# Patient Record
Sex: Male | Born: 1970 | Race: Black or African American | Hispanic: No | Smoking: Current every day smoker
Health system: Southern US, Community
[De-identification: ages and names within clinical notes are randomized; demographics above are authoritative.]

## PROBLEM LIST (undated history)

## (undated) DIAGNOSIS — K469 Unspecified abdominal hernia without obstruction or gangrene: Secondary | ICD-10-CM

## (undated) DIAGNOSIS — K219 Gastro-esophageal reflux disease without esophagitis: Secondary | ICD-10-CM

---

## 1989-09-11 HISTORY — PX: KNEE SURGERY: SHX244

## 2010-08-24 ENCOUNTER — Emergency Department (HOSPITAL_COMMUNITY)
Admission: EM | Admit: 2010-08-24 | Discharge: 2010-08-25 | Payer: Self-pay | Source: Home / Self Care | Admitting: Emergency Medicine

## 2010-09-10 ENCOUNTER — Emergency Department (HOSPITAL_COMMUNITY)
Admission: EM | Admit: 2010-09-10 | Discharge: 2010-09-10 | Payer: Self-pay | Source: Home / Self Care | Admitting: Emergency Medicine

## 2010-11-22 LAB — DIFFERENTIAL
Basophils Absolute: 0 10*3/uL (ref 0.0–0.1)
Eosinophils Absolute: 0.1 10*3/uL (ref 0.0–0.7)
Eosinophils Relative: 1 % (ref 0–5)

## 2010-11-22 LAB — CBC
MCH: 32.3 pg (ref 26.0–34.0)
MCHC: 35.9 g/dL (ref 30.0–36.0)
MCV: 90.1 fL (ref 78.0–100.0)
Platelets: 195 10*3/uL (ref 150–400)
RDW: 14.1 % (ref 11.5–15.5)

## 2010-11-22 LAB — BASIC METABOLIC PANEL
BUN: 8 mg/dL (ref 6–23)
CO2: 25 mEq/L (ref 19–32)
Chloride: 106 mEq/L (ref 96–112)
Creatinine, Ser: 1.1 mg/dL (ref 0.4–1.5)
Glucose, Bld: 133 mg/dL — ABNORMAL HIGH (ref 70–99)

## 2010-11-22 LAB — URINALYSIS, ROUTINE W REFLEX MICROSCOPIC
Bilirubin Urine: NEGATIVE
Ketones, ur: NEGATIVE mg/dL
Nitrite: NEGATIVE
Protein, ur: NEGATIVE mg/dL

## 2011-10-02 ENCOUNTER — Encounter (HOSPITAL_COMMUNITY): Payer: Self-pay | Admitting: *Deleted

## 2011-10-02 ENCOUNTER — Emergency Department (HOSPITAL_COMMUNITY)
Admission: EM | Admit: 2011-10-02 | Discharge: 2011-10-02 | Disposition: A | Discharge status: Home / Self Care | Payer: Self-pay | Attending: Emergency Medicine | Admitting: Emergency Medicine

## 2011-10-02 DIAGNOSIS — F17200 Nicotine dependence, unspecified, uncomplicated: Secondary | ICD-10-CM | POA: Insufficient documentation

## 2011-10-02 DIAGNOSIS — H00019 Hordeolum externum unspecified eye, unspecified eyelid: Secondary | ICD-10-CM | POA: Insufficient documentation

## 2011-10-02 MED ORDER — CIPROFLOXACIN HCL 500 MG PO TABS: 500.0000 mg | ORAL_TABLET | Freq: Two times a day (BID) | ORAL | Status: AC

## 2011-10-02 MED ORDER — HYDROCODONE-ACETAMINOPHEN 5-325 MG PO TABS: ORAL_TABLET | ORAL | Status: DC

## 2011-10-02 MED ORDER — TOBRAMYCIN 0.3 % OP SOLN
2.0000 [drp] | Freq: Once | OPHTHALMIC | Status: AC
  Administered 2011-10-02: 2 [drp] via OPHTHALMIC
  Filled 2011-10-02: qty 5

## 2011-10-02 NOTE — ED Provider Notes (Signed)
History     CSN: [id number]  Arrival date & time 10/02/11  1105   First MD Initiated Contact with Patient 10/02/11 1135      Chief Complaint  Patient presents with  . Eye Pain    (Consider location/radiation/quality/duration/timing/severity/associated sxs/prior treatment) HPI Comments: Patient states he noted a" bump on his eye" approximately 3-4 days ago. The area became quite sore. It most recently the area beside it underneath the left eye has become swollen and tender. He denies any blurred vision double vision or changes in vision. He denies any injury to the eye. He has not been grinding metal or wood, or any high velocity projectile store his eye.  Patient is a 41 y.o. male presenting with eye pain. The history is provided by the patient.  Eye Pain Pertinent negatives include no abdominal pain, arthralgias, chest pain, coughing or neck pain.    History reviewed. No pertinent past medical history.  History reviewed. No pertinent past surgical history.  No family history on file.  History  Substance Use Topics  . Smoking status: Current Everyday Smoker -- 0.5 packs/day for 25 years    Types: Cigarettes  . Smokeless tobacco: Not on file  . Alcohol Use: Yes     Occassionally      Review of Systems  Constitutional: Negative for activity change.       All ROS Neg except as noted in HPI  HENT: Negative for nosebleeds and neck pain.   Eyes: Positive for pain, discharge and redness. Negative for photophobia and visual disturbance.  Respiratory: Negative for cough, shortness of breath and wheezing.   Cardiovascular: Negative for chest pain and palpitations.  Gastrointestinal: Negative for abdominal pain and blood in stool.  Genitourinary: Negative for dysuria, frequency and hematuria.  Musculoskeletal: Negative for back pain and arthralgias.  Skin: Negative.   Neurological: Negative for dizziness, seizures and speech difficulty.  Psychiatric/Behavioral: Negative for  hallucinations and confusion.    Allergies  Penicillins  Home Medications   Current Outpatient Rx  Name Route Sig Dispense Refill  . CIPROFLOXACIN HCL 500 MG PO TABS Oral Take 1 tablet (500 mg total) by mouth every 12 (twelve) hours. 14 tablet 0  . HYDROCODONE-ACETAMINOPHEN 5-325 MG PO TABS  1 or 2 po q4h prn pain 15 tablet 0    BP 121/89  Pulse 72  Temp(Src) 98.4 F (36.9 C) (Oral)  Resp 20  Ht 6' (1.829 m)  Wt 215 lb (97.523 kg)  BMI 29.16 kg/m2  SpO2 100%  Physical Exam  Nursing note and vitals reviewed. Constitutional: He is oriented to person, place, and time. He appears well-developed and well-nourished.  Non-toxic appearance.  HENT:  Head: Normocephalic.  Right Ear: Tympanic membrane and external ear normal.  Left Ear: Tympanic membrane and external ear normal.  Eyes: EOM and lids are normal. Pupils are equal, round, and reactive to light.       There is a stye of the left lateral upper lid of the eye. There is increased redness of the conjunctiva. There is mild soreness of the lateral eye and just under the orbital rim. There is no increased redness. The area is not hot.  Neck: Normal range of motion. Neck supple. Carotid bruit is not present.  Cardiovascular: Normal rate, regular rhythm, normal heart sounds, intact distal pulses and normal pulses.   Pulmonary/Chest: Breath sounds normal. No respiratory distress.  Abdominal: Soft. Bowel sounds are normal. There is no tenderness. There is no guarding.  Musculoskeletal: Normal  range of motion.  Lymphadenopathy:       Head (right side): No submandibular adenopathy present.       Head (left side): No submandibular adenopathy present.    He has no cervical adenopathy.  Neurological: He is alert and oriented to person, place, and time. He has normal strength. No cranial nerve deficit or sensory deficit.  Skin: Skin is warm and dry.  Psychiatric: He has a normal mood and affect. His speech is normal.    ED Course    Procedures (including critical care time)  Labs Reviewed - No data to display No results found.   1. Stye       MDM  I have reviewed nursing notes, vital signs, and all appropriate lab and imaging results for this patient. Patient to use warm compresses to the left eye 3 times a day. Tobramycin eyedrops given. Norco 5 mg to be used for pain not improved by Tylenol or Motrin. Patient is to see an eye specialist if not improving.       Kathie Dike, [place] 10/02/11 [phone]

## 2011-10-02 NOTE — ED Notes (Signed)
Lt eye swelling, started with a "bump on pupil". Pt denies vision loss and injury.

## 2011-10-03 NOTE — ED Provider Notes (Signed)
Medical screening examination/treatment/procedure(s) were performed by non-physician practitioner and as supervising physician I was immediately available for consultation/collaboration.  Doloras Tellado, MD 10/03/11 0709 

## 2011-12-02 ENCOUNTER — Emergency Department (HOSPITAL_COMMUNITY)
Admission: EM | Admit: 2011-12-02 | Discharge: 2011-12-02 | Disposition: A | Discharge status: Home / Self Care | Payer: Self-pay | Attending: Emergency Medicine | Admitting: Emergency Medicine

## 2011-12-02 ENCOUNTER — Encounter (HOSPITAL_COMMUNITY): Payer: Self-pay | Admitting: Emergency Medicine

## 2011-12-02 DIAGNOSIS — F17200 Nicotine dependence, unspecified, uncomplicated: Secondary | ICD-10-CM | POA: Insufficient documentation

## 2011-12-02 DIAGNOSIS — K4090 Unilateral inguinal hernia, without obstruction or gangrene, not specified as recurrent: Secondary | ICD-10-CM | POA: Insufficient documentation

## 2011-12-02 DIAGNOSIS — Z880 Allergy status to penicillin: Secondary | ICD-10-CM | POA: Insufficient documentation

## 2011-12-02 MED ORDER — HYDROCODONE-ACETAMINOPHEN 5-325 MG PO TABS: 1.0000 | ORAL_TABLET | Freq: Four times a day (QID) | ORAL | Status: AC | PRN

## 2011-12-02 NOTE — ED Notes (Signed)
Pt c/o left groin pain x one month. Pt states it is a knot there that he could push in, but now it will not go.

## 2011-12-02 NOTE — ED Provider Notes (Signed)
History     CSN: [id number]  Arrival date & time 12/02/11  1719   First MD Initiated Contact with Patient 12/02/11 1736      Chief Complaint  Patient presents with  . Groin Pain    (Consider location/radiation/quality/duration/timing/severity/associated sxs/prior treatment) HPI Jay Weber is a 41 y.o. male who presents to the Emergency Department complaining of acute onset moderate to severe left groin pain for one month. Discomfort and bulge have been there for one month and pt is able to push it back in. No associated signs or symptoms  History reviewed. No pertinent past medical history.  History reviewed. No pertinent past surgical history.  History reviewed. No pertinent family history.  History  Substance Use Topics  . Smoking status: Current Everyday Smoker -- 0.5 packs/day for 25 years    Types: Cigarettes  . Smokeless tobacco: Not on file  . Alcohol Use: Yes     Occassionally      Review of Systems  HENT: Positive for congestion. Negative for neck pain.   Respiratory: Positive for cough and shortness of breath.   Cardiovascular: Negative for leg swelling.  Gastrointestinal: Positive for abdominal pain (cramping). Negative for nausea, vomiting and diarrhea.  Musculoskeletal: Negative for back pain.  Skin: Negative for rash.  All other systems reviewed and are negative.    Allergies  Penicillins  Home Medications   Current Outpatient Rx  Name Route Sig Dispense Refill  . HYDROCODONE-ACETAMINOPHEN 5-325 MG PO TABS  1 or 2 po q4h prn pain 15 tablet 0  . HYDROCODONE-ACETAMINOPHEN 5-325 MG PO TABS Oral Take 1-2 tablets by mouth every 6 (six) hours as needed for pain. 10 tablet 0    BP 119/72  Pulse 88  Temp(Src) 98 F (36.7 C) (Oral)  Resp 18  Ht 6' (1.829 m)  Wt 214 lb (97.07 kg)  BMI 29.02 kg/m2  SpO2 100%  Physical Exam  Nursing note and vitals reviewed. Constitutional: He is oriented to person, place, and time. He appears  well-developed and well-nourished.  HENT:  Head: Normocephalic and atraumatic.  Neck: Normal range of motion. Neck supple.  Cardiovascular: Normal rate, regular rhythm and normal heart sounds.   Pulmonary/Chest: Effort normal and breath sounds normal. No respiratory distress.  Abdominal: Soft. Bowel sounds are normal. A hernia is present. Hernia confirmed positive in the left inguinal area. Hernia confirmed negative in the right inguinal area.  Genitourinary: Penis normal. Circumcised.       Left inguinal hernia with a bulge that reduces  Musculoskeletal: Normal range of motion.  Neurological: He is alert and oriented to person, place, and time.  Skin: Skin is warm and dry.  Psychiatric: He has a normal mood and affect. His behavior is normal. Judgment and thought content normal.    ED Course  Procedures (including critical care time) DIAGNOSTIC STUDIES: Oxygen Saturation is 100% on room air, normal by my interpretation.    COORDINATION OF CARE:  Medications  HYDROcodone-acetaminophen (NORCO) 5-325 MG per tablet (not administered)      Labs Reviewed - No data to display No results found.   1. Left inguinal hernia       MDM  Physical findings consistent with a reducible left inguinal hernia no apparent complications at this time. Referral to general surgery made.    I personally performed the services described in this documentation, which was scribed in my presence. The recorded information has been reviewed and considered.  Shelda Jakes, MD 12/02/11 [phone]

## 2011-12-02 NOTE — Discharge Instructions (Signed)
Hernia A hernia happens when an organ inside your body pushes out through a weak spot in your belly (abdominal) wall. Most hernias get worse over time. They can often be pushed back into place (reduced). Surgery may be needed to repair hernias that cannot be pushed into place. HOME CARE  Keep doing normal activities.   Avoid lifting more than 10 pounds (4.5 kilograms).   Cough gently and avoid straining. Over time, these things will:   Increase your hernia size.   Irritate your hernia.   Break down hernia repairs.   Stop smoking.   Do not wear anything tight over your hernia. Do not keep the hernia in with an outside bandage.   Eat food that is high in fiber (fruit, vegetables, whole grains).   Drink enough fluids to keep your pee (urine) clear or pale yellow.   Take medicines to make your poop soft (stool softeners) if you cannot poop (constipated).  GET HELP RIGHT AWAY IF:   You have a fever.   You have belly pain that gets worse.   You feel sick to your stomach (nauseous) and throw up (vomit).   Your skin starts to bulge out.   Your hernia turns a different color, feels hard, or is tender.   You have increased pain or puffiness (swelling) around the hernia.   You poop more or less often.   Your poop does not look the way normally does.   You have watery poop (diarrhea).   You cannot push the hernia back in place by applying gentle pressure while lying down.  MAKE SURE YOU:   Understand these instructions.   Will watch your condition.   Will get help right away if you are not doing well or get worse.  Document Released: 02/15/2010 Document Revised: 08/17/2011 Document Reviewed: 02/15/2010 [employer] Patient Information 2012 [employer]. 

## 2011-12-18 NOTE — Patient Instructions (Addendum)
20 JAQUA CHING  12/18/2011   Your procedure is scheduled on:   12/22/2011  Report to [facility] at  715  AM.  Call this number if you have problems the morning of surgery: [phone]   Remember:   Do not eat food:After Midnight.  May have clear liquids:until Midnight .  Clear liquids include soda, tea, black coffee, apple or grape juice, broth.  Take these medicines the morning of surgery with A SIP OF WATER:  none   Do not wear jewelry, make-up or nail polish.  Do not wear lotions, powders, or perfumes. You may wear deodorant.  Do not shave 48 hours prior to surgery.  Do not bring valuables to the hospital.  Contacts, dentures or bridgework may not be worn into surgery.  Leave suitcase in the car. After surgery it may be brought to your room.  For patients admitted to the hospital, checkout time is 11:00 AM the day of discharge.   Patients discharged the day of surgery will not be allowed to drive home.  Name and phone number of your driver: family  Special Instructions: CHG Shower Use Special Wash: 1/2 bottle night before surgery and 1/2 bottle morning of surgery.   Please read over the following fact sheets that you were given: Pain Booklet, MRSA Information, Surgical Site Infection Prevention, Anesthesia Post-op Instructions and Care and Recovery After Surgery Inguinal Hernia, Adult Muscles help keep everything in the body in its proper place. But if a weak spot in the muscles develops, something can poke through. That is called a hernia. When this happens in the lower part of the belly (abdomen), it is called an inguinal hernia. (It takes its name from a part of the body in this region called the inguinal canal.) A weak spot in the wall of muscles lets some fat or part of the small intestine bulge through. An inguinal hernia can develop at any age. Men get them more often than women. CAUSES  In adults, an inguinal hernia develops over time.  It can be triggered by:   Suddenly  straining the muscles of the lower abdomen.   Lifting heavy objects.   Straining to have a bowel movement. Difficult bowel movements (constipation) can lead to this.   Constant coughing. This may be caused by smoking or lung disease.   Being overweight.   Being pregnant.   Working at a job that requires long periods of standing or heavy lifting.   Having had an inguinal hernia before.  One type can be an emergency situation. It is called a strangulated inguinal hernia. It develops if part of the small intestine slips through the weak spot and cannot get back into the abdomen. The blood supply can be cut off. If that happens, part of the intestine may die. This situation requires emergency surgery. SYMPTOMS  Often, a small inguinal hernia has no symptoms. It is found when a healthcare provider does a physical exam. Larger hernias usually have symptoms.   In adults, symptoms may include:   A lump in the groin. This is easier to see when the person is standing. It might disappear when lying down.   In men, a lump in the scrotum.   Pain or burning in the groin. This occurs especially when lifting, straining or coughing.   A dull ache or feeling of pressure in the groin.   Signs of a strangulated hernia can include:   A bulge in the groin that becomes very painful and  tender to the touch.   A bulge that turns red or purple.   Fever, nausea and vomiting.   Inability to have a bowel movement or to pass gas.  DIAGNOSIS  To decide if you have an inguinal hernia, a healthcare provider will probably do a physical examination.  This will include asking questions about any symptoms you have noticed.   The healthcare provider might feel the groin area and ask you to cough. If an inguinal hernia is felt, the healthcare provider may try to slide it back into the abdomen.   Usually no other tests are needed.  TREATMENT  Treatments can vary. The size of the hernia makes a difference.  Options include:  Watchful waiting. This is often suggested if the hernia is small and you have had no symptoms.   No medical procedure will be done unless symptoms develop.   You will need to watch closely for symptoms. If any occur, contact your healthcare provider right away.   Surgery. This is used if the hernia is larger or you have symptoms.   Open surgery. This is usually an outpatient procedure (you will not stay overnight in a hospital). An cut (incision) is made through the skin in the groin. The hernia is put back inside the abdomen. The weak area in the muscles is then repaired by herniorrhaphy or hernioplasty. Herniorrhaphy: in this type of surgery, the weak muscles are sewn back together. Hernioplasty: a patch or mesh is used to close the weak area in the abdominal wall.   Laparoscopy. In this procedure, a surgeon makes small incisions. A thin tube with a tiny video camera (called a laparoscope) is put into the abdomen. The surgeon repairs the hernia with mesh by looking with the video camera and using two long instruments.  HOME CARE INSTRUCTIONS   After surgery to repair an inguinal hernia:   You will need to take pain medicine prescribed by your healthcare provider. Follow all directions carefully.   You will need to take care of the wound from the incision.   Your activity will be restricted for awhile. This will probably include no heavy lifting for several weeks. You also should not do anything too active for a few weeks. When you can return to work will depend on the type of job that you have.   During "watchful waiting" periods, you should:   Maintain a healthy weight.   Eat a diet high in fiber (fruits, vegetables and whole grains).   Drink plenty of fluids to avoid constipation. This means drinking enough water and other liquids to keep your urine clear or pale yellow.   Do not lift heavy objects.   Do not stand for long periods of time.   Quit smoking. This  should keep you from developing a frequent cough.  SEEK MEDICAL CARE IF:   A bulge develops in your groin area.   You feel pain, a burning sensation or pressure in the groin. This might be worse if you are lifting or straining.   You develop a fever of more than 100.5 F (38.1 C).  SEEK IMMEDIATE MEDICAL CARE IF:   Pain in the groin increases suddenly.   A bulge in the groin gets bigger suddenly and does not go down.   For men, there is sudden pain in the scrotum. Or, the size of the scrotum increases.   A bulge in the groin area becomes red or purple and is painful to touch.   You  have nausea or vomiting that does not go away.   You feel your heart beating much faster than normal.   You cannot have a bowel movement or pass gas.   You develop a fever of more than 102.0 F (38.9 C).  Document Released: 01/14/2009 Document Revised: 08/17/2011 Document Reviewed: 01/14/2009 [facility] Patient Information 2012 [place], [place].PATIENT INSTRUCTIONS POST-ANESTHESIA  IMMEDIATELY FOLLOWING SURGERY:  Do not drive or operate machinery for the first twenty four hours after surgery.  Do not make any important decisions for twenty four hours after surgery or while taking narcotic pain medications or sedatives.  If you develop intractable nausea and vomiting or a severe headache please notify your doctor immediately.  FOLLOW-UP:  Please make an appointment with your surgeon as instructed. You do not need to follow up with anesthesia unless specifically instructed to do so.  WOUND CARE INSTRUCTIONS (if applicable):  Keep a dry clean dressing on the anesthesia/puncture wound site if there is drainage.  Once the wound has quit draining you may leave it open to air.  Generally you should leave the bandage intact for twenty four hours unless there is drainage.  If the epidural site drains for more than 36-48 hours please call the anesthesia department.  QUESTIONS?:  Please feel free to call your  physician or the hospital operator if you have any questions, and they will be happy to assist you.     [facility] Anesthesia Department [address] [place] [phone]

## 2011-12-19 ENCOUNTER — Encounter (HOSPITAL_COMMUNITY): Payer: Self-pay | Admitting: Pharmacy Technician

## 2011-12-19 ENCOUNTER — Encounter (HOSPITAL_COMMUNITY)
Admission: RE | Admit: 2011-12-19 | Discharge: 2011-12-19 | Disposition: A | Discharge status: Home / Self Care | Payer: Self-pay | Source: Ambulatory Visit | Attending: General Surgery | Admitting: General Surgery

## 2011-12-19 ENCOUNTER — Encounter (HOSPITAL_COMMUNITY): Payer: Self-pay

## 2011-12-19 HISTORY — DX: Gastro-esophageal reflux disease without esophagitis: K21.9

## 2011-12-19 LAB — BASIC METABOLIC PANEL
BUN: 7 mg/dL (ref 6–23)
CO2: 26 mEq/L (ref 19–32)
Calcium: 9.6 mg/dL (ref 8.4–10.5)
Creatinine, Ser: 0.98 mg/dL (ref 0.50–1.35)
GFR calc non Af Amer: >90 mL/min (ref >90)
Glucose, Bld: 87 mg/dL (ref 70–99)
Sodium: 140 mEq/L (ref 135–145)

## 2011-12-19 LAB — DIFFERENTIAL
Eosinophils Absolute: 0.1 10*3/uL (ref 0.0–0.7)
Eosinophils Relative: 1 % (ref 0–5)
Lymphs Abs: 2.5 10*3/uL (ref 0.7–4.0)
Monocytes Absolute: 0.5 10*3/uL (ref 0.1–1.0)

## 2011-12-19 LAB — CBC
HCT: 41.8 % (ref 39.0–52.0)
MCH: 32.1 pg (ref 26.0–34.0)
MCV: 93.1 fL (ref 78.0–100.0)
Platelets: 187 10*3/uL (ref 150–400)
RBC: 4.49 MIL/uL (ref 4.22–5.81)

## 2011-12-19 MED ORDER — CHLORHEXIDINE GLUCONATE 4 % EX LIQD
1.0000 "application " | Freq: Once | CUTANEOUS | Status: DC
  Filled 2011-12-19: qty 15

## 2011-12-19 NOTE — Progress Notes (Signed)
Contact patient at [phone].

## 2011-12-22 ENCOUNTER — Encounter (HOSPITAL_COMMUNITY): Payer: Self-pay | Admitting: Anesthesiology

## 2011-12-22 ENCOUNTER — Encounter (HOSPITAL_COMMUNITY): Payer: Self-pay | Admitting: *Deleted

## 2011-12-22 ENCOUNTER — Encounter (HOSPITAL_COMMUNITY)
Admission: RE | Disposition: A | Discharge status: Home / Self Care | Payer: Self-pay | Source: Ambulatory Visit | Attending: General Surgery

## 2011-12-22 ENCOUNTER — Ambulatory Visit (HOSPITAL_COMMUNITY)
Admission: RE | Admit: 2011-12-22 | Discharge: 2011-12-22 | Disposition: A | Discharge status: Home / Self Care | Payer: Self-pay | Source: Ambulatory Visit | Attending: General Surgery | Admitting: General Surgery

## 2011-12-22 ENCOUNTER — Ambulatory Visit (HOSPITAL_COMMUNITY): Payer: Self-pay | Admitting: Anesthesiology

## 2011-12-22 DIAGNOSIS — K4090 Unilateral inguinal hernia, without obstruction or gangrene, not specified as recurrent: Secondary | ICD-10-CM | POA: Insufficient documentation

## 2011-12-22 DIAGNOSIS — Z01812 Encounter for preprocedural laboratory examination: Secondary | ICD-10-CM | POA: Insufficient documentation

## 2011-12-22 HISTORY — PX: INGUINAL HERNIA REPAIR: SHX194

## 2011-12-22 SURGERY — REPAIR, HERNIA, INGUINAL, ADULT
Anesthesia: General | Site: Groin | Laterality: Left | Wound class: Clean

## 2011-12-22 MED ORDER — ONDANSETRON HCL 4 MG/2ML IJ SOLN: 4.0000 mg | Freq: Once | INTRAMUSCULAR | Status: DC | PRN

## 2011-12-22 MED ORDER — PROPOFOL 10 MG/ML IV EMUL
INTRAVENOUS | Status: AC
  Filled 2011-12-22: qty 20

## 2011-12-22 MED ORDER — MIDAZOLAM HCL 2 MG/2ML IJ SOLN
INTRAMUSCULAR | Status: AC
  Administered 2011-12-22: 2 mg via INTRAVENOUS
  Filled 2011-12-22: qty 2

## 2011-12-22 MED ORDER — CELECOXIB 100 MG PO CAPS
ORAL_CAPSULE | ORAL | Status: DC
Start: 2011-12-22 — End: 2011-12-22
  Filled 2011-12-22: qty 4

## 2011-12-22 MED ORDER — LACTATED RINGERS IV SOLN
INTRAVENOUS | Status: DC
  Administered 2011-12-22: 1000 mL via INTRAVENOUS
  Administered 2011-12-22: 11:00:00 via INTRAVENOUS

## 2011-12-22 MED ORDER — MIDAZOLAM HCL 2 MG/2ML IJ SOLN
INTRAMUSCULAR | Status: AC
  Filled 2011-12-22: qty 2

## 2011-12-22 MED ORDER — FENTANYL CITRATE 0.05 MG/ML IJ SOLN
25.0000 ug | INTRAMUSCULAR | Status: DC | PRN
  Administered 2011-12-22 (×2): 50 ug via INTRAVENOUS

## 2011-12-22 MED ORDER — CLINDAMYCIN PHOSPHATE 600 MG/50ML IV SOLN
INTRAVENOUS | Status: DC | PRN
  Administered 2011-12-22: 600 mg via INTRAVENOUS

## 2011-12-22 MED ORDER — MIDAZOLAM HCL 5 MG/5ML IJ SOLN
INTRAMUSCULAR | Status: DC | PRN
  Administered 2011-12-22: 2 mg via INTRAVENOUS

## 2011-12-22 MED ORDER — MIDAZOLAM HCL 2 MG/2ML IJ SOLN
1.0000 mg | INTRAMUSCULAR | Status: DC | PRN
  Administered 2011-12-22: 2 mg via INTRAVENOUS

## 2011-12-22 MED ORDER — GLYCOPYRROLATE 0.2 MG/ML IJ SOLN
INTRAMUSCULAR | Status: AC
  Administered 2011-12-22: 0.2 mg via INTRAVENOUS
  Filled 2011-12-22: qty 1

## 2011-12-22 MED ORDER — ENOXAPARIN SODIUM 40 MG/0.4ML ~~LOC~~ SOLN
40.0000 mg | Freq: Once | SUBCUTANEOUS | Status: AC
  Administered 2011-12-22: 40 mg via SUBCUTANEOUS

## 2011-12-22 MED ORDER — GLYCOPYRROLATE 0.2 MG/ML IJ SOLN
0.2000 mg | Freq: Once | INTRAMUSCULAR | Status: AC
  Administered 2011-12-22: 0.2 mg via INTRAVENOUS

## 2011-12-22 MED ORDER — FENTANYL CITRATE 0.05 MG/ML IJ SOLN
INTRAMUSCULAR | Status: AC
  Administered 2011-12-22: 50 ug via INTRAVENOUS
  Filled 2011-12-22: qty 2

## 2011-12-22 MED ORDER — ACETAMINOPHEN 325 MG PO TABS: 325.0000 mg | ORAL_TABLET | ORAL | Status: DC | PRN

## 2011-12-22 MED ORDER — CELECOXIB 100 MG PO CAPS
400.0000 mg | ORAL_CAPSULE | Freq: Every day | ORAL | Status: AC
  Administered 2011-12-22: 400 mg via ORAL

## 2011-12-22 MED ORDER — BUPIVACAINE HCL (PF) 0.5 % IJ SOLN
INTRAMUSCULAR | Status: AC
  Filled 2011-12-22: qty 30

## 2011-12-22 MED ORDER — ONDANSETRON HCL 4 MG/2ML IJ SOLN
INTRAMUSCULAR | Status: AC
  Administered 2011-12-22: 4 mg via INTRAVENOUS
  Filled 2011-12-22: qty 2

## 2011-12-22 MED ORDER — CLINDAMYCIN PHOSPHATE 600 MG/50ML IV SOLN
INTRAVENOUS | Status: AC
  Filled 2011-12-22: qty 50

## 2011-12-22 MED ORDER — CLINDAMYCIN PHOSPHATE 600 MG/50ML IV SOLN: 600.0000 mg | Freq: Once | INTRAVENOUS | Status: DC

## 2011-12-22 MED ORDER — PROPOFOL 10 MG/ML IV BOLUS
INTRAVENOUS | Status: DC | PRN
  Administered 2011-12-22: 170 mg via INTRAVENOUS

## 2011-12-22 MED ORDER — ENOXAPARIN SODIUM 40 MG/0.4ML ~~LOC~~ SOLN
SUBCUTANEOUS | Status: AC
  Administered 2011-12-22: 40 mg via SUBCUTANEOUS
  Filled 2011-12-22: qty 0.4

## 2011-12-22 MED ORDER — SODIUM CHLORIDE 0.9 % IR SOLN
Status: DC | PRN
  Administered 2011-12-22: 1000 mL

## 2011-12-22 MED ORDER — FENTANYL CITRATE 0.05 MG/ML IJ SOLN
INTRAMUSCULAR | Status: DC | PRN
  Administered 2011-12-22 (×2): 25 ug via INTRAVENOUS
  Administered 2011-12-22: 50 ug via INTRAVENOUS

## 2011-12-22 MED ORDER — LIDOCAINE HCL 1 % IJ SOLN
INTRAMUSCULAR | Status: DC | PRN
  Administered 2011-12-22: 30 mg via INTRADERMAL

## 2011-12-22 MED ORDER — HYDROCODONE-ACETAMINOPHEN 5-325 MG PO TABS: 1.0000 | ORAL_TABLET | ORAL | Status: AC | PRN

## 2011-12-22 MED ORDER — BUPIVACAINE HCL (PF) 0.5 % IJ SOLN
INTRAMUSCULAR | Status: DC | PRN
  Administered 2011-12-22: 10 mL

## 2011-12-22 MED ORDER — ONDANSETRON HCL 4 MG/2ML IJ SOLN
4.0000 mg | Freq: Once | INTRAMUSCULAR | Status: AC
  Administered 2011-12-22: 4 mg via INTRAVENOUS

## 2011-12-22 SURGICAL SUPPLY — 38 items
BAG HAMPER (MISCELLANEOUS) ×2 IMPLANT
BENZOIN TINCTURE PRP APPL 2/3 (GAUZE/BANDAGES/DRESSINGS) ×2 IMPLANT
CLOTH BEACON ORANGE TIMEOUT ST (SAFETY) ×2 IMPLANT
COVER SURGICAL LIGHT HANDLE (MISCELLANEOUS) ×4 IMPLANT
DECANTER SPIKE VIAL GLASS SM (MISCELLANEOUS) ×2 IMPLANT
DRAIN PENROSE 18X.75 LTX STRL (MISCELLANEOUS) ×2 IMPLANT
DURAPREP 26ML APPLICATOR (WOUND CARE) ×2 IMPLANT
ELECT REM PT RETURN 9FT ADLT (ELECTROSURGICAL) ×2
ELECTRODE REM PT RTRN 9FT ADLT (ELECTROSURGICAL) ×1 IMPLANT
FORMALIN 10 PREFIL 120ML (MISCELLANEOUS) ×2 IMPLANT
GLOVE BIOGEL PI IND STRL 7.5 (GLOVE) ×1 IMPLANT
GLOVE BIOGEL PI INDICATOR 7.5 (GLOVE) ×1
GLOVE ECLIPSE 6.5 STRL STRAW (GLOVE) ×4 IMPLANT
GLOVE ECLIPSE 7.0 STRL STRAW (GLOVE) ×4 IMPLANT
GLOVE EXAM NITRILE MD LF STRL (GLOVE) ×2 IMPLANT
GLOVE INDICATOR 7.0 STRL GRN (GLOVE) ×6 IMPLANT
GOWN STRL REIN XL XLG (GOWN DISPOSABLE) ×6 IMPLANT
INST SET MINOR GENERAL (KITS) ×2 IMPLANT
KIT ROOM TURNOVER APOR (KITS) ×2 IMPLANT
MANIFOLD NEPTUNE II (INSTRUMENTS) ×2 IMPLANT
MESH MARLEX PLUG MEDIUM (Mesh General) ×2 IMPLANT
NEEDLE HYPO 25X1 1.5 SAFETY (NEEDLE) ×2 IMPLANT
NS IRRIG 1000ML POUR BTL (IV SOLUTION) ×2 IMPLANT
PACK MINOR (CUSTOM PROCEDURE TRAY) ×2 IMPLANT
PAD ARMBOARD 7.5X6 YLW CONV (MISCELLANEOUS) ×2 IMPLANT
SET BASIN LINEN APH (SET/KITS/TRAYS/PACK) ×2 IMPLANT
STRIP CLOSURE SKIN 1/2X4 (GAUZE/BANDAGES/DRESSINGS) ×2 IMPLANT
SUT ETHIBOND NAB MO 7 #0 18IN (SUTURE) ×2 IMPLANT
SUT MNCRL AB 4-0 PS2 18 (SUTURE) ×2 IMPLANT
SUT SILK 2 0 (SUTURE)
SUT SILK 2-0 18XBRD TIE 12 (SUTURE) IMPLANT
SUT VIC AB 2-0 CT1 27 (SUTURE) ×1
SUT VIC AB 2-0 CT1 TAPERPNT 27 (SUTURE) ×1 IMPLANT
SUT VIC AB 3-0 SH 27 (SUTURE) ×1
SUT VIC AB 3-0 SH 27X BRD (SUTURE) ×1 IMPLANT
SUT VICRYL AB 3 0 TIES (SUTURE) IMPLANT
SYR BULB IRRIGATION 50ML (SYRINGE) ×2 IMPLANT
SYR CONTROL 10ML LL (SYRINGE) ×2 IMPLANT

## 2011-12-22 NOTE — Discharge Instructions (Signed)
Inguinal Hernia, Adult  Care After Refer to this sheet in the next few weeks. These discharge instructions provide you with general information on caring for yourself after you leave the hospital. Your caregiver may also give you specific instructions. Your treatment has been planned according to the most current medical practices available, but unavoidable complications sometimes occur. If you have any problems or questions after discharge, please call your caregiver. HOME CARE INSTRUCTIONS  Put ice on the operative site.   Put ice in a plastic bag.   Place a towel between your skin and the bag.   Leave the ice on for 15 to 20 minutes at a time, 3 to 4 times a day while awake.   Change bandages (dressings) as directed.   Keep the wound dry and clean. The wound may be washed gently with soap and water. Gently blot or dab the wound dry. It is okay to take showers 24 to 48 hours after surgery. Do not take baths, use swimming pools, or use hot tubs for 10 days, or as directed by your caregiver.   Only take over-the-counter or prescription medicines for pain, discomfort, or fever as directed by your caregiver.   Continue your normal diet as directed.   Do not lift anything more than 10 pounds or play contact sports for 3 weeks, or as directed.  SEEK MEDICAL CARE IF:  There is redness, swelling, or increasing pain in the wound.   There is fluid (pus) coming from the wound.   There is drainage from a wound lasting longer than 1 day.   You have an oral temperature above 102 F (38.9 C).   You notice a bad smell coming from the wound or dressing.   The wound breaks open after the stitches (sutures) have been removed.   You notice increasing pain in the shoulders (shoulder strap areas).   You develop dizzy episodes or fainting while standing.   You feel sick to your stomach (nauseous) or throw up (vomit).  SEEK IMMEDIATE MEDICAL CARE IF:  You develop a rash.   You have difficulty  breathing.   You develop a reaction or have side effects to medicines you were given.  MAKE SURE YOU:   Understand these instructions.   Will watch your condition.   Will get help right away if you are not doing well or get worse.  Document Released: 09/28/2006 Document Revised: 08/17/2011 Document Reviewed: 07/28/2009 [employer] Patient Information 2012 [employer].   

## 2011-12-22 NOTE — Anesthesia Procedure Notes (Signed)
Procedure Name: LMA Insertion Date/Time: 12/22/2011 9:35 AM Performed by: Despina Hidden Pre-anesthesia Checklist: Patient identified, Patient being monitored, Suction available and Emergency Drugs available Patient Re-evaluated:Patient Re-evaluated prior to inductionOxygen Delivery Method: Circle system utilized Preoxygenation: Pre-oxygenation with 100% oxygen Intubation Type: IV induction Ventilation: Mask ventilation with difficulty LMA: LMA inserted LMA Size: 4.0 Number of attempts: 2 Placement Confirmation: breath sounds checked- equal and bilateral and positive ETCO2 Tube secured with: Tape Dental Injury: Teeth and Oropharynx as per pre-operative assessment  Comments: Difficult mask due to thick neck and face

## 2011-12-22 NOTE — Transfer of Care (Signed)
Immediate Anesthesia Transfer of Care Note  Patient: Jay Weber  Procedure(s) Performed: Procedure(s) (LRB): HERNIA REPAIR INGUINAL ADULT (Left)  Patient Location: PACU  Anesthesia Type: General  Level of Consciousness: awake and patient cooperative  Airway & Oxygen Therapy: Patient Spontanous Breathing and Patient connected to face mask oxygen  Post-op Assessment: Report given to PACU RN, Post -op Vital signs reviewed and stable and Patient moving all extremities  Post vital signs: Reviewed and stable  Complications: No apparent anesthesia complications

## 2011-12-22 NOTE — Anesthesia Postprocedure Evaluation (Signed)
  Anesthesia Post-op Note  Patient: Jay Weber  Procedure(s) Performed: Procedure(s) (LRB): HERNIA REPAIR INGUINAL ADULT (Left)  Patient Location: PACU  Anesthesia Type: General  Level of Consciousness: awake, alert , oriented and patient cooperative  Airway and Oxygen Therapy: Patient Spontanous Breathing  Post-op Pain: 2 /10, mild  Post-op Assessment: Post-op Vital signs reviewed, Patient's Cardiovascular Status Stable, Respiratory Function Stable, Patent Airway and No signs of Nausea or vomiting  Post-op Vital Signs: Reviewed and stable  Complications: No apparent anesthesia complications

## 2011-12-22 NOTE — Anesthesia Preprocedure Evaluation (Signed)
Anesthesia Evaluation  Patient identified by MRN, date of birth, ID band Patient awake    Reviewed: Allergy & Precautions, H&P , NPO status , Patient's Chart, lab work & pertinent test results  Airway Mallampati: I TM Distance: >3 FB Neck ROM: Full    Dental No notable dental hx. (+) Teeth Intact   Pulmonary Current Smoker,    Pulmonary exam normal       Cardiovascular negative cardio ROS  Rhythm:Regular Rate:Normal     Neuro/Psych negative neurological ROS  negative psych ROS   GI/Hepatic Neg liver ROS, GERD-  Controlled,  Endo/Other  negative endocrine ROS  Renal/GU negative Renal ROS     Musculoskeletal negative musculoskeletal ROS (+)   Abdominal   Peds  Hematology negative hematology ROS (+)   Anesthesia Other Findings   Reproductive/Obstetrics                           Anesthesia Physical Anesthesia Plan  ASA: II  Anesthesia Plan: General   Post-op Pain Management:    Induction: Intravenous  Airway Management Planned: LMA  Additional Equipment:   Intra-op Plan:   Post-operative Plan: Extubation in OR  Informed Consent: I have reviewed the patients History and Physical, chart, labs and discussed the procedure including the risks, benefits and alternatives for the proposed anesthesia with the patient or authorized representative who has indicated his/her understanding and acceptance.     Plan Discussed with: CRNA  Anesthesia Plan Comments:         Anesthesia Quick Evaluation

## 2011-12-22 NOTE — Op Note (Signed)
Patient:  Jay Weber  DOB:  07-Jul-1971  MRN:  [id number]   Preop Diagnosis:  Left inguinal hernia  Postop Diagnosis:  The same  Procedure:  Left inguinal hernia repair with mesh  Surgeon:  Dr. Tilford Pillar  Anes:  General endotracheal, 0.5% Sensorcaine plain for local  Indications:  Patient is a 41 year old male presented my office with a history of a left inguinal bulge. Workup and evaluation was consistent with a left inguinal hernia. Risks benefits alternatives of a dual hernia repair with mesh were discussed at length patient including but not limited to risk of bleeding, infection, infection and mesh requiring removal and subsequent repair, chronic pain, ischemic orchiditis, testicular loss, intraoperative cardiac and coronary events. Patient's questions and concerns were addressed. Consent is obtained.  Procedure note:  Patient is taken to the or was placed in supine position on the or table which time a general anesthetic was missed her period was patient was asleep he was endotracheally intubated by the nurse anesthetist. At this point his abdomen is prepped with chlorhexidine solution and draped in standard fashion. A left inguinal incision was created with a 10 blade scalpel. Additional dissection down to subcuticular tissues including Scarpa's fascia is electrocautery. The external oblique fascia is scored with a 15 blade scalpel. It is opened medially to the external inguinal ring with Metzenbaum scissors. The cord structures and hernia sac were identified and bluntly shipped off the surrounding canal walls with blunt digital dissection. A window screw behind the cord structures over the pubic tubercle. A Penrose drain is utilized to help retract and elevate the cord structures into the field. At this point the hernia sac is identified and was dissected off the cord structures. The sac is divided. The distal portion of the sac is ligated with a Vicryl suture. The proximal sac  Pershing suture placed to help close the peritoneal defect. This is performed with an 0 Ethibond suture. A medium mesh plug is inserted. This is secured with 2 separate 0 Ethibond sutures. The mesh onlay was then brought to the field and was tacked medially over the pubic tubercle, inferiorly to the inguinal ligament, superiorly to the conjoined tendon, and laterally the tracheal defect was secured around the cord structures and again secured with 0 Ethibond suture. The tails of the mesh are placed in the external oblique fascia. As quite pleased with the appearance of the repair. Irrigation was instilled. The external oblique fascia was reapproximated using a 2-0 Vicryl in a running continuous fashion. Local acetic is instilled. A 3-0 Vicryl sutures utilized reapproximate Scarpa's fascia. A 4-0 Monocryl utilized reapproximate skin edges. The skin was washed dried moist dry towel. Benzoin is applied. Half-inch are suture placed. Additionally patient left come out of general anesthetic and stretcher the postanesthetic care unit in stable condition. At the conclusion of procedure all instrument, sponge, needle counts are correct. Patient tolerated procedure extremely well.  Complications:  None  EBL:  Minimal  Specimen:  None

## 2011-12-22 NOTE — Interval H&P Note (Signed)
History and Physical Interval Note:  12/22/2011 9:18 AM  Jay Weber  has presented today for surgery, with the diagnosis of Inguinal hernia without mention of obstruction or gangrene, unilateral or unspecified, not specified as recurrent  The various methods of treatment have been discussed with the patient and family. After consideration of risks, benefits and other options for treatment, the patient has consented to  Procedure(s) (LRB): HERNIA REPAIR INGUINAL ADULT (Left) as a surgical intervention .  The patients' history has been reviewed, patient examined, no change in status, stable for surgery.  I have reviewed the patients' chart and labs.  Questions were answered to the patient's satisfaction.     Ugonna Keirsey C

## 2011-12-22 NOTE — H&P (Signed)
  NTS SOAP Note  Vital Signs:  Vitals as of: 12/07/2011: Systolic 139: Diastolic 77: Heart Rate 67: Temp 98.61F: Height 28ft 0in: Weight 215Lbs 0 Ounces: OFC 0in: Respiratory Rate 0: O2 Saturation 0: Pain Level 0: BMI 29  BMI : 29.16 kg/m2  Subjective: This 41 Years 79 Months old Male presents for of note pain and bulge in L groin.  Present for the last couple weeks.  Has been increasing in size.  Reduces by description.  No ssx of incarceration or strangulation.  No fever or chills.  No prior history.  Patient states cannot return to work until fixed per employer.  No change in BM.  No trauma.  Review of Symptoms:  Constitutional:unremarkable Head:unremarkable Eyes:unremarkable Nose/Mouth/Throat:unremarkable Cardiovascular:unremarkable Respiratory:unremarkable as per HPI Genitourinary:unremarkable Musculoskeletal:unremarkable Skin:unremarkable Breast:unremarkable Hematolgic/Lymphatic:unremarkable Allergic/Immunologic:unremarkable   Past Medical History:Obtained   Past Medical History  Surgical History: none Medical Problems: none Psychiatric History: none Allergies: PCN:  rash Medications: none   Social History:Obtained  Social History  Preferred Language: English ([place]) Race:  Black or African American Ethnicity: Not Hispanic / Latino Age: 41 Years 0 Months Marital Status:  D Alcohol: 6pack/week Recreational drug(s): none   Smoking Status: Current every day smoker reviewed on 12/10/2011 Started Date: 09/12/1987 Packs per day: 1.00   Family History:Obtained   Family History  Is there a family history [id number]    Objective Information: General:Well appearing, well nourished in no distress. Skin:no rash or prominent lesions Head:Atraumatic; no masses; no abnormalities Eyes:conjunctiva clear, EOM intact, PERRL Mouth:Mucous membranes moist, no mucosal  lesions. Neck:Supple without lymphadenopathy.  Heart:RRR, no murmur Lungs:CTA bilaterally, no wheezes, rhonchi, rales.  Breathing unlabored. Abdomen:Soft, NT/ND, no HSM, no masses.Patient has SMALL umbilical defect.  Small right inguinal hernia.  Moderate LIH.  Reducible. Normal male external male genitalia. Extremities:No deformities, clubbing, cyanosis, or edema.   Assessment:  Diagnosis &amp; Procedure: DiagnosisCode: 550.90, ProcedureCode: 47829,    Plan: LIH.  Options discussed with the patient.  Will proceed at his convenience.  SSX of incar/strangulation discussed.  Patient will call with issues.  Patient Education:Alternative treatments to surgery were discussed with patient (and family).Risks and benefits  of procedure were fully explained to the patient (and family) who gave informed consent. Patient/family questions were addressed.  Follow-up:Pending Surgery                                             Active Diagnosis and Procedures: 550.90 Unilateral or unspecified inguinal hernia, without mention of obstruction or gangrene (not specified as recurrent)   56213 - OFFICE OUTPATIENT NEW 30 MINUTES        This note has been electronically signed by Fabio Bering MD 12/10/2011 2:39 PM

## 2011-12-27 ENCOUNTER — Encounter (HOSPITAL_COMMUNITY): Payer: Self-pay | Admitting: General Surgery

## 2013-08-18 ENCOUNTER — Emergency Department (HOSPITAL_COMMUNITY)
Admission: EM | Admit: 2013-08-18 | Discharge: 2013-08-18 | Disposition: A | Discharge status: Home / Self Care | Payer: Self-pay | Attending: Emergency Medicine | Admitting: Emergency Medicine

## 2013-08-18 ENCOUNTER — Encounter (HOSPITAL_COMMUNITY): Payer: Self-pay | Admitting: Emergency Medicine

## 2013-08-18 DIAGNOSIS — J3489 Other specified disorders of nose and nasal sinuses: Secondary | ICD-10-CM | POA: Insufficient documentation

## 2013-08-18 DIAGNOSIS — Z8719 Personal history of other diseases of the digestive system: Secondary | ICD-10-CM | POA: Insufficient documentation

## 2013-08-18 DIAGNOSIS — R0602 Shortness of breath: Secondary | ICD-10-CM | POA: Insufficient documentation

## 2013-08-18 DIAGNOSIS — Z880 Allergy status to penicillin: Secondary | ICD-10-CM | POA: Insufficient documentation

## 2013-08-18 DIAGNOSIS — N63 Unspecified lump in unspecified breast: Secondary | ICD-10-CM | POA: Insufficient documentation

## 2013-08-18 DIAGNOSIS — F17200 Nicotine dependence, unspecified, uncomplicated: Secondary | ICD-10-CM | POA: Insufficient documentation

## 2013-08-18 HISTORY — DX: Unspecified abdominal hernia without obstruction or gangrene: K46.9

## 2013-08-18 MED ORDER — NAPROXEN 250 MG PO TABS
500.0000 mg | ORAL_TABLET | Freq: Once | ORAL | Status: AC
  Administered 2013-08-18: 500 mg via ORAL
  Filled 2013-08-18: qty 2

## 2013-08-18 MED ORDER — DOXYCYCLINE HYCLATE 100 MG PO TABS
100.0000 mg | ORAL_TABLET | Freq: Once | ORAL | Status: AC
  Administered 2013-08-18: 100 mg via ORAL
  Filled 2013-08-18: qty 1

## 2013-08-18 MED ORDER — DOXYCYCLINE HYCLATE 100 MG PO CAPS: 100.0000 mg | ORAL_CAPSULE | Freq: Two times a day (BID) | ORAL | Status: DC

## 2013-08-18 MED ORDER — NAPROXEN 500 MG PO TABS: 500.0000 mg | ORAL_TABLET | Freq: Two times a day (BID) | ORAL | Status: DC

## 2013-08-18 NOTE — ED Notes (Signed)
Pt states he has a node on left side of his left chest. With it he reports pain at 6 (0-10).

## 2013-08-18 NOTE — ED Provider Notes (Signed)
CSN: [id number]     Arrival date & time 08/18/13  0827 History   First MD Initiated Contact with Patient 08/18/13 0829    This chart was scribed for Shelda Jakes, MD by Ellin Mayhew, ED Scribe. This patient was seen in room [facility] and the patient's care was started at 8:36 AM.    Chief Complaint  Patient presents with  . Mass    Patient is a 42 y.o. male presenting with general illness. The history is provided by the patient. No language interpreter was used.  Illness Location:  Left chest under left aeolar Quality:  Aching Onset quality:  Gradual Duration:  2 weeks Timing:  Constant Progression:  Worsening Chronicity:  New Worsened by:  Contact Associated symptoms: congestion and shortness of breath   Associated symptoms: no abdominal pain, no chest pain, no cough, no diarrhea, no fever, no headaches, no nausea, no rash, no sore throat and no vomiting    HPI Comments: ILIJAH DOUCET is a 42 y.o. male who presents to the Emergency Department complaining of a mass on the left upper chest that began two weeks ago. Pt states he has never had trouble with this before and that pain was exacerbated last night at work. Pt rates the pain as a 6-7/10 and has associated SOB during times of pain. He states the pain is made worse with direct contact. Pt denies any fever, chills, visual changes, but has had congestion. Pt denies any diarrhea, dyspnea, or hematuria.   No PCP.  Past Medical History  Diagnosis Date  . GERD (gastroesophageal reflux disease)   . Hernia    Past Surgical History  Procedure Laterality Date  . Knee surgery  1991    Knee injury; [place]  . Inguinal hernia repair  12/22/2011    Procedure: HERNIA REPAIR INGUINAL ADULT;  Surgeon: Fabio Bering, MD;  Location: [facility];  Service: General;  Laterality: Left;   No family history on file. History  Substance Use Topics  . Smoking status: Current Every Day Smoker -- 0.50 packs/day for 27 years    Types:  Cigarettes  . Smokeless tobacco: Never Used  . Alcohol Use: Yes     Comment: Occassionally    Review of Systems  Constitutional: Negative for fever and chills.  HENT: Positive for congestion. Negative for sore throat.   Eyes: Negative for visual disturbance.  Respiratory: Positive for shortness of breath. Negative for cough.   Cardiovascular: Negative for chest pain and leg swelling.  Gastrointestinal: Negative for nausea, vomiting, abdominal pain and diarrhea.  Genitourinary: Negative for dysuria and hematuria.  Musculoskeletal: Negative for back pain and neck pain.  Skin: Negative for rash.  Neurological: Negative for headaches.  Hematological: Does not bruise/bleed easily.  Psychiatric/Behavioral: Negative for confusion.    Allergies  Bee venom; Penicillins; and Shellfish allergy  Home Medications   Current Outpatient Rx  Name  Route  Sig  Dispense  Refill  . doxycycline (VIBRAMYCIN) 100 MG capsule   Oral   Take 1 capsule (100 mg total) by mouth 2 (two) times daily.   14 capsule   0   . EPINEPHrine (EPI-PEN) 0.3 mg/0.3 mL DEVI   Intramuscular   Inject 0.3 mg into the muscle as needed. For allergic reactions         . naproxen (NAPROSYN) 500 MG tablet   Oral   Take 1 tablet (500 mg total) by mouth 2 (two) times daily.   14 tablet   1  Triage Vitals: BP 130/80  Pulse 66  Temp(Src) 97.6 F (36.4 C) (Oral)  Resp 18  Ht 6' (1.829 m)  Wt 230 lb (104.327 kg)  BMI 31.19 kg/m2  SpO2 98%  Physical Exam  Nursing note and vitals reviewed. Constitutional: He is oriented to person, place, and time. He appears well-developed and well-nourished. No distress.  HENT:  Head: Normocephalic and atraumatic.  Mouth/Throat: Oropharynx is clear and moist.  Eyes: Conjunctivae and EOM are normal. Pupils are equal, round, and reactive to light.  Sclera are clear  Neck: Neck supple. No tracheal deviation present.  Cardiovascular: Normal rate, regular rhythm and normal heart  sounds.   No murmur heard. Pulses:      Dorsalis pedis pulses are 2+ on the right side, and 2+ on the left side.  Pulmonary/Chest: Effort normal and breath sounds normal. No respiratory distress. He has no wheezes. He exhibits no tenderness.  Area of fullness that measures 2x2 cm under the left areolar. No redness, no increased warmth, no discharge.   Abdominal: Soft. Bowel sounds are normal. He exhibits no distension. There is no tenderness.  Musculoskeletal: Normal range of motion. He exhibits no edema (no ankle swelling).  No swelling in ankles.  Lymphadenopathy:    He has no cervical adenopathy.    He has no axillary adenopathy.  Neurological: He is alert and oriented to person, place, and time. No cranial nerve deficit.  Pt able to move both sets of fingers and toes  Skin: Skin is warm and dry. No rash noted.  Psychiatric: He has a normal mood and affect. His behavior is normal.     ED Course  Procedures (including critical care time)  DIAGNOSTIC STUDIES: Oxygen Saturation is 98% on room air, normal by my interpretation.    COORDINATION OF CARE: 8:54 AM-Discussed possibility of inflammation in the chest and will prescribe anti-inflammatory and anti-biotic. Patient will monitor symptoms and return in a few weeks. Treatment plan discussed with patient and patient agreed.  Labs Review Labs Reviewed - No data to display Imaging Review No results found.  EKG Interpretation   None       MDM   1. Breast mass in male    Patient with the left breast mass. May just be inflammatory since tender no evidence of abscess. Will treat with anti-inflammatories and antibiotic and followup with Gen. surgery. Right breast without any mass no left axillary adenopathy.  I personally performed the services described in this documentation, which was scribed in my presence. The recorded information has been reviewed and is accurate.     Shelda Jakes, MD 08/18/13 [phone]

## 2017-08-28 ENCOUNTER — Ambulatory Visit: Payer: Worker's Compensation | Admitting: General Surgery

## 2017-08-28 ENCOUNTER — Ambulatory Visit (INDEPENDENT_AMBULATORY_CARE_PROVIDER_SITE_OTHER): Payer: Worker's Compensation | Admitting: General Surgery

## 2017-08-28 ENCOUNTER — Encounter: Payer: Self-pay | Admitting: General Surgery

## 2017-08-28 VITALS — BP 168/98 | HR 92 | Temp 99.6°F | Ht 72.0 in | Wt 237.0 lb

## 2017-08-28 DIAGNOSIS — K4090 Unilateral inguinal hernia, without obstruction or gangrene, not specified as recurrent: Secondary | ICD-10-CM | POA: Diagnosis not present

## 2017-08-28 NOTE — Progress Notes (Signed)
Jay Weber; [id number]; 1971/08/07   HPI Patient is a 46 year old black male who was referred to my care by Dr. Wende CreaseGuarino for evaluation and treatment of a right inguinal hernia.  He sustained a right inguinal hernia while lifting something heavy at work on approximately 08/11/2017.  He continues to have pain in the right inguinal region when lifting something heavy.  It does reduce on its own when lying down.  He currently has a pain of 5 out of 10.  No nausea or vomiting have been noted. Past Medical History:  Diagnosis Date  . GERD (gastroesophageal reflux disease)   . Hernia     Past Surgical History:  Procedure Laterality Date  . INGUINAL HERNIA REPAIR  12/22/2011   Procedure: HERNIA REPAIR INGUINAL ADULT;  Surgeon: Fabio BeringBrent C Ziegler, MD;  Location: [facility];  Service: General;  Laterality: Left;  . KNEE SURGERY  1991   Knee injury; [place]    History reviewed. No pertinent family history.  Current Outpatient Medications on File Prior to Visit  Medication Sig Dispense Refill  . doxycycline (VIBRAMYCIN) 100 MG capsule Take 1 capsule (100 mg total) by mouth 2 (two) times daily. 14 capsule 0  . EPINEPHrine (EPI-PEN) 0.3 mg/0.3 mL DEVI Inject 0.3 mg into the muscle as needed. For allergic reactions    . naproxen (NAPROSYN) 500 MG tablet Take 1 tablet (500 mg total) by mouth 2 (two) times daily. 14 tablet 1   No current facility-administered medications on file prior to visit.     Allergies  Allergen Reactions  . Bee Venom Anaphylaxis  . Penicillins Anaphylaxis  . Shellfish Allergy Anaphylaxis    Social History   Substance and Sexual Activity  Alcohol Use Yes   Comment: Occassionally    Social History   Tobacco Use  Smoking Status Current Every Day Smoker  . Packs/day: 0.50  . Years: 27.00  . Pack years: 13.50  . Types: Cigarettes  Smokeless Tobacco Never Used    Review of Systems  Constitutional: Negative.   HENT: Negative.   Eyes: Negative.   Respiratory:  Negative.   Cardiovascular: Negative.   Gastrointestinal: Positive for abdominal pain.  Genitourinary: Negative.   Musculoskeletal: Negative.   Skin: Negative.   Neurological: Negative.   Endo/Heme/Allergies: Negative.   Psychiatric/Behavioral: Negative.     Objective   Vitals:   08/28/17 1354  BP: (!) 168/98  Pulse: 92  Temp: 99.6 F (37.6 C)    Physical Exam  Constitutional: He is oriented to person, place, and time and well-developed, well-nourished, and in no distress.  HENT:  Head: Normocephalic and atraumatic.  Cardiovascular: Normal rate, regular rhythm and normal heart sounds. Exam reveals no gallop and no friction rub.  No murmur heard. Pulmonary/Chest: Effort normal and breath sounds normal. No respiratory distress. He has no wheezes. He has no rales.  Abdominal: Soft. Bowel sounds are normal. He exhibits no distension. There is no tenderness. There is no rebound.  Reducible right inguinal hernia.  Genitourinary:  Genitourinary Comments: Genitourinary examination within normal limits.  Neurological: He is alert and oriented to person, place, and time.  Skin: Skin is warm and dry.  Vitals reviewed.   Assessment  Right inguinal hernia Plan   Patient is scheduled for right inguinal herniorrhaphy with mesh on 08/31/2017.  The risks and benefits of the procedure including bleeding, infection, mesh use, and the possibility of recurrence of the hernia were fully explained to the patient, who gave informed consent.  We will monitor  his blood pressure.  

## 2017-08-28 NOTE — H&P (Signed)
Jay Weber; [id number]; 1971/08/07   HPI Patient is a 46 year old black male who was referred to my care by Dr. Wende CreaseGuarino for evaluation and treatment of a right inguinal hernia.  He sustained a right inguinal hernia while lifting something heavy at work on approximately 08/11/2017.  He continues to have pain in the right inguinal region when lifting something heavy.  It does reduce on its own when lying down.  He currently has a pain of 5 out of 10.  No nausea or vomiting have been noted. Past Medical History:  Diagnosis Date  . GERD (gastroesophageal reflux disease)   . Hernia     Past Surgical History:  Procedure Laterality Date  . INGUINAL HERNIA REPAIR  12/22/2011   Procedure: HERNIA REPAIR INGUINAL ADULT;  Surgeon: Fabio BeringBrent C Ziegler, MD;  Location: [facility];  Service: General;  Laterality: Left;  . KNEE SURGERY  1991   Knee injury; [place]    History reviewed. No pertinent family history.  Current Outpatient Medications on File Prior to Visit  Medication Sig Dispense Refill  . doxycycline (VIBRAMYCIN) 100 MG capsule Take 1 capsule (100 mg total) by mouth 2 (two) times daily. 14 capsule 0  . EPINEPHrine (EPI-PEN) 0.3 mg/0.3 mL DEVI Inject 0.3 mg into the muscle as needed. For allergic reactions    . naproxen (NAPROSYN) 500 MG tablet Take 1 tablet (500 mg total) by mouth 2 (two) times daily. 14 tablet 1   No current facility-administered medications on file prior to visit.     Allergies  Allergen Reactions  . Bee Venom Anaphylaxis  . Penicillins Anaphylaxis  . Shellfish Allergy Anaphylaxis    Social History   Substance and Sexual Activity  Alcohol Use Yes   Comment: Occassionally    Social History   Tobacco Use  Smoking Status Current Every Day Smoker  . Packs/day: 0.50  . Years: 27.00  . Pack years: 13.50  . Types: Cigarettes  Smokeless Tobacco Never Used    Review of Systems  Constitutional: Negative.   HENT: Negative.   Eyes: Negative.   Respiratory:  Negative.   Cardiovascular: Negative.   Gastrointestinal: Positive for abdominal pain.  Genitourinary: Negative.   Musculoskeletal: Negative.   Skin: Negative.   Neurological: Negative.   Endo/Heme/Allergies: Negative.   Psychiatric/Behavioral: Negative.     Objective   Vitals:   08/28/17 1354  BP: (!) 168/98  Pulse: 92  Temp: 99.6 F (37.6 C)    Physical Exam  Constitutional: He is oriented to person, place, and time and well-developed, well-nourished, and in no distress.  HENT:  Head: Normocephalic and atraumatic.  Cardiovascular: Normal rate, regular rhythm and normal heart sounds. Exam reveals no gallop and no friction rub.  No murmur heard. Pulmonary/Chest: Effort normal and breath sounds normal. No respiratory distress. He has no wheezes. He has no rales.  Abdominal: Soft. Bowel sounds are normal. He exhibits no distension. There is no tenderness. There is no rebound.  Reducible right inguinal hernia.  Genitourinary:  Genitourinary Comments: Genitourinary examination within normal limits.  Neurological: He is alert and oriented to person, place, and time.  Skin: Skin is warm and dry.  Vitals reviewed.   Assessment  Right inguinal hernia Plan   Patient is scheduled for right inguinal herniorrhaphy with mesh on 08/31/2017.  The risks and benefits of the procedure including bleeding, infection, mesh use, and the possibility of recurrence of the hernia were fully explained to the patient, who gave informed consent.  We will monitor  his blood pressure.

## 2017-08-28 NOTE — Patient Instructions (Signed)

## 2017-08-29 ENCOUNTER — Encounter (HOSPITAL_COMMUNITY): Payer: Self-pay

## 2017-08-29 ENCOUNTER — Other Ambulatory Visit: Payer: Self-pay

## 2017-08-29 ENCOUNTER — Encounter (HOSPITAL_COMMUNITY)
Admission: RE | Admit: 2017-08-29 | Discharge: 2017-08-29 | Disposition: A | Discharge status: Home / Self Care | Payer: Self-pay | Source: Ambulatory Visit | Attending: General Surgery | Admitting: General Surgery

## 2017-08-29 DIAGNOSIS — Z91013 Allergy to seafood: Secondary | ICD-10-CM | POA: Insufficient documentation

## 2017-08-29 DIAGNOSIS — F17210 Nicotine dependence, cigarettes, uncomplicated: Secondary | ICD-10-CM | POA: Insufficient documentation

## 2017-08-29 DIAGNOSIS — Z01812 Encounter for preprocedural laboratory examination: Secondary | ICD-10-CM | POA: Insufficient documentation

## 2017-08-29 DIAGNOSIS — K4090 Unilateral inguinal hernia, without obstruction or gangrene, not specified as recurrent: Secondary | ICD-10-CM | POA: Insufficient documentation

## 2017-08-29 DIAGNOSIS — Z880 Allergy status to penicillin: Secondary | ICD-10-CM | POA: Insufficient documentation

## 2017-08-29 DIAGNOSIS — Z91030 Bee allergy status: Secondary | ICD-10-CM | POA: Insufficient documentation

## 2017-08-29 LAB — BASIC METABOLIC PANEL
ANION GAP: 11 (ref 5–15)
BUN: 9 mg/dL (ref 6–20)
CO2: 23 mmol/L (ref 22–32)
Calcium: 9.1 mg/dL (ref 8.9–10.3)
Chloride: 106 mmol/L (ref 101–111)
Creatinine, Ser: 1.09 mg/dL (ref 0.61–1.24)
GFR calc non Af Amer: >60 mL/min (ref >60)
Glucose, Bld: 157 mg/dL — ABNORMAL HIGH (ref 65–99)
Potassium: 3.8 mmol/L (ref 3.5–5.1)
Sodium: 140 mmol/L (ref 135–145)

## 2017-08-29 LAB — CBC WITH DIFFERENTIAL/PLATELET
BASOS ABS: 0 10*3/uL (ref 0.0–0.1)
Basophils Relative: 0 %
Eosinophils Absolute: 0.2 10*3/uL (ref 0.0–0.7)
Eosinophils Relative: 2 %
HEMATOCRIT: 42.9 % (ref 39.0–52.0)
HEMOGLOBIN: 14.5 g/dL (ref 13.0–17.0)
Lymphocytes Relative: 26 %
Lymphs Abs: 2.1 10*3/uL (ref 0.7–4.0)
MCH: 31.9 pg (ref 26.0–34.0)
MCHC: 33.8 g/dL (ref 30.0–36.0)
MCV: 94.5 fL (ref 78.0–100.0)
MONOS PCT: 7 %
Monocytes Absolute: 0.6 10*3/uL (ref 0.1–1.0)
NEUTROS ABS: 5.3 10*3/uL (ref 1.7–7.7)
NEUTROS PCT: 65 %
Platelets: 254 10*3/uL (ref 150–400)
RBC: 4.54 MIL/uL (ref 4.22–5.81)
RDW: 13.4 % (ref 11.5–15.5)
WBC: 8.3 10*3/uL (ref 4.0–10.5)

## 2017-08-29 NOTE — Progress Notes (Signed)
**Note De-Identified Jerrica Thorman Obfuscation** EKG cancelled to avoid patient charge.

## 2017-08-29 NOTE — Patient Instructions (Addendum)
Jay Weber  08/29/2017     @PREFPERIOPPHARMACY @   Your procedure is scheduled on 08/31/17.  Report to [facility] at 09:00 A.M.  Call this number if you have problems the morning of surgery:  [phone]   Remember:  Do not eat food or drink liquids after midnight.  Take these medicines the morning of surgery with A SIP OF WATER :doxycycline. STOP NAPROXEN UNTIL AFTER SURGERY.   Do not wear jewelry, make-up or nail polish.  Do not wear lotions, powders, or perfumes, or deodorant.  Do not shave 48 hours prior to surgery.  Men may shave face and neck.  Do not bring valuables to the hospital.  [facility] any belongings or valuables.  Contacts, dentures or bridgework may not be worn into surgery.  Leave your suitcase in the car.  After surgery it may be brought to your room.  For patients admitted to the hospital, discharge time will be determined by your treatment team.  Patients discharged the day of surgery will not be allowed to drive home.   Name and phone number of your driver:    Special instructions:   Please read over the following fact sheets that you were given. Anesthesia Post-op Instructions    Laparoscopic Inguinal Hernia Repair, Adult, Care After This sheet gives you information about how to care for yourself after your procedure. Your health care provider may also give you more specific instructions. If you have problems or questions, contact your health care provider. What can I expect after the procedure? After the procedure, it is common to have:  Pain.  Swelling and bruising around the incision area.  Scrotal swelling, in men.  Some fluid or blood draining from your incisions.  Follow these instructions at home: Incision care  Follow instructions from your health care provider about how to take care of your incisions. Make sure you: ? Wash your hands with soap and water before you change your bandage (dressing). If  soap and water are not available, use hand sanitizer. ? Change your dressing as told by your health care provider. ? Leave stitches (sutures), skin glue, or adhesive strips in place. These skin closures may need to stay in place for 2 weeks or longer. If adhesive strip edges start to loosen and curl up, you may trim the loose edges. Do not remove adhesive strips completely unless your health care provider tells you to do that.  Check your incision area every day for signs of infection. Check for: ? More redness, swelling, or pain. ? More fluid or blood. ? Warmth. ? Pus or a bad smell.  Wear loose, soft clothing while your incisions heal. Driving  Do not drive or use heavy machinery while taking prescription pain medicine.  Do not drive for 24 hours if you were given a medicine to help you relax (sedative) during your procedure. Activity  Do not lift anything that is heavier than 10 lb (4.5 kg), or the limit that you are told, until your health care provider says that it is safe.  Ask your health care provider what activities are safe for you.A lot of activity during the first week after surgery can increase pain and swelling. For 1 week after your procedure: ? Avoid activities that take a lot of effort, such as exercise or sports. ? You may walk and climb stairs as needed for daily activity, but avoid long walks or climbing stairs for exercise. Managing pain and swelling  Put ice on painful or swollen areas: ? Put ice in a plastic bag. ? Place a towel between your skin and the bag. ? Leave the ice on for 20 minutes, 2-3 times a day. General instructions  Do not take baths, swim, or use a hot tub until your health care provider approves. Ask your health care provider if you may take showers. You may only be allowed to take sponge baths.  Take over-the-counter and prescription medicines only as told by your health care provider.  To prevent or treat constipation while you are taking  prescription pain medicine, your health care provider may recommend that you: ? Drink enough fluid to keep your urine pale yellow. ? Take over-the-counter or prescription medicines. ? Eat foods that are high in fiber, such as fresh fruits and vegetables, whole grains, and beans. ? Limit foods that are high in fat and processed sugars, such as fried and sweet foods.  Do not use any products that contain nicotine or tobacco, such as cigarettes and e-cigarettes. If you need help quitting, ask your health care provider.  Drink enough fluid to keep your urine pale yellow.  Keep all follow-up visits as told by your health care provider. This is important. Contact a health care provider if:  You have more redness, swelling, or pain around your incisions or your groin area.  You have more swelling in your scrotum.  You have more fluid or blood coming from your incisions.  Your incisions feel warm to the touch.  You have severe pain and medicines do not help.  You have abdominal pain or swelling.  You cannot eat or drink without vomiting.  You cannot urinate or pass a bowel movement.  You faint.  You feel dizzy.  You have nausea and vomiting.  You have a fever. Get help right away if:  You have pus or a bad smell coming from your incisions.  You have chest pain.  You have problems breathing. Summary  Pain, swelling, and bruising are common after the procedure.  Check your incision area every day for signs of infection, such as more redness, swelling, or pain.  Put ice on painful or swollen areas for 20 minutes, 2-3 times a day. This information is not intended to replace advice given to you by your health care provider. Make sure you discuss any questions you have with your health care provider. Document Released: 12/07/2016 Document Revised: 12/07/2016 Document Reviewed: 12/07/2016 Elsevier Interactive Patient Education  2018 [employer].   General Anesthesia, Adult,  Care After These instructions provide you with information about caring for yourself after your procedure. Your health care provider may also give you more specific instructions. Your treatment has been planned according to current medical practices, but problems sometimes occur. Call your health care provider if you have any problems or questions after your procedure. What can I expect after the procedure? After the procedure, it is common to have:  Vomiting.  A sore throat.  Mental slowness.  It is common to feel:  Nauseous.  Cold or shivery.  Sleepy.  Tired.  Sore or achy, even in parts of your body where you did not have surgery.  Follow these instructions at home: For at least 24 hours after the procedure:  Do not: ? Participate in activities where you could fall or become injured. ? Drive. ? Use heavy machinery. ? Drink alcohol. ? Take sleeping pills or medicines that cause drowsiness. ? Make important decisions or sign legal documents. ?  Take care of children on your own.  Rest. Eating and drinking  If you vomit, drink water, juice, or soup when you can drink without vomiting.  Drink enough fluid to keep your urine clear or pale yellow.  Make sure you have little or no nausea before eating solid foods.  Follow the diet recommended by your health care provider. General instructions  Have a responsible adult stay with you until you are awake and alert.  Return to your normal activities as told by your health care provider. Ask your health care provider what activities are safe for you.  Take over-the-counter and prescription medicines only as told by your health care provider.  If you smoke, do not smoke without supervision.  Keep all follow-up visits as told by your health care provider. This is important. Contact a health care provider if:  You continue to have nausea or vomiting at home, and medicines are not helpful.  You cannot drink fluids or  start eating again.  You cannot urinate after 8-12 hours.  You develop a skin rash.  You have fever.  You have increasing redness at the site of your procedure. Get help right away if:  You have difficulty breathing.  You have chest pain.  You have unexpected bleeding.  You feel that you are having a life-threatening or urgent problem. This information is not intended to replace advice given to you by your health care provider. Make sure you discuss any questions you have with your health care provider. Document Released: 12/04/2000 Document Revised: 01/31/2016 Document Reviewed: 08/12/2015 Elsevier Interactive Patient Education  [employer]2018 Elsevier Inc.

## 2017-08-31 ENCOUNTER — Ambulatory Visit (HOSPITAL_COMMUNITY)
Admission: RE | Admit: 2017-08-31 | Discharge: 2017-08-31 | Disposition: A | Discharge status: Home / Self Care | Payer: Worker's Compensation | Source: Ambulatory Visit | Attending: General Surgery | Admitting: General Surgery

## 2017-08-31 ENCOUNTER — Ambulatory Visit (HOSPITAL_COMMUNITY): Payer: Worker's Compensation | Admitting: Anesthesiology

## 2017-08-31 ENCOUNTER — Encounter (HOSPITAL_COMMUNITY)
Admission: RE | Disposition: A | Discharge status: Home / Self Care | Payer: Self-pay | Source: Ambulatory Visit | Attending: General Surgery

## 2017-08-31 ENCOUNTER — Encounter (HOSPITAL_COMMUNITY): Payer: Self-pay | Admitting: *Deleted

## 2017-08-31 DIAGNOSIS — F17210 Nicotine dependence, cigarettes, uncomplicated: Secondary | ICD-10-CM | POA: Insufficient documentation

## 2017-08-31 DIAGNOSIS — Z791 Long term (current) use of non-steroidal anti-inflammatories (NSAID): Secondary | ICD-10-CM | POA: Insufficient documentation

## 2017-08-31 DIAGNOSIS — K219 Gastro-esophageal reflux disease without esophagitis: Secondary | ICD-10-CM | POA: Diagnosis not present

## 2017-08-31 DIAGNOSIS — Z91013 Allergy to seafood: Secondary | ICD-10-CM | POA: Diagnosis not present

## 2017-08-31 DIAGNOSIS — Z91030 Bee allergy status: Secondary | ICD-10-CM | POA: Diagnosis not present

## 2017-08-31 DIAGNOSIS — Z880 Allergy status to penicillin: Secondary | ICD-10-CM | POA: Insufficient documentation

## 2017-08-31 DIAGNOSIS — K4090 Unilateral inguinal hernia, without obstruction or gangrene, not specified as recurrent: Secondary | ICD-10-CM

## 2017-08-31 HISTORY — PX: INGUINAL HERNIA REPAIR: SHX194

## 2017-08-31 SURGERY — REPAIR, HERNIA, INGUINAL, ADULT
Anesthesia: General | Site: Groin | Laterality: Right

## 2017-08-31 MED ORDER — MIDAZOLAM HCL 2 MG/2ML IJ SOLN
INTRAMUSCULAR | Status: AC
  Filled 2017-08-31: qty 2

## 2017-08-31 MED ORDER — PROPOFOL 10 MG/ML IV BOLUS
INTRAVENOUS | Status: AC
  Filled 2017-08-31: qty 40

## 2017-08-31 MED ORDER — CHLORHEXIDINE GLUCONATE CLOTH 2 % EX PADS: 6.0000 | MEDICATED_PAD | Freq: Once | CUTANEOUS | Status: DC

## 2017-08-31 MED ORDER — FENTANYL CITRATE (PF) 250 MCG/5ML IJ SOLN
INTRAMUSCULAR | Status: AC
  Filled 2017-08-31: qty 5

## 2017-08-31 MED ORDER — BUPIVACAINE LIPOSOME 1.3 % IJ SUSP
INTRAMUSCULAR | Status: DC | PRN
  Administered 2017-08-31: 20 mL

## 2017-08-31 MED ORDER — LIDOCAINE HCL (CARDIAC) 20 MG/ML IV SOLN
INTRAVENOUS | Status: DC | PRN
  Administered 2017-08-31: 50 mg via INTRAVENOUS

## 2017-08-31 MED ORDER — MIDAZOLAM HCL 2 MG/2ML IJ SOLN
1.0000 mg | INTRAMUSCULAR | Status: AC
  Administered 2017-08-31: 2 mg via INTRAVENOUS

## 2017-08-31 MED ORDER — DEXTROSE 5 % IV SOLN
INTRAVENOUS | Status: DC | PRN
  Administered 2017-08-31: 07:00:00 via INTRAVENOUS

## 2017-08-31 MED ORDER — KETOROLAC TROMETHAMINE 30 MG/ML IJ SOLN
INTRAMUSCULAR | Status: AC
  Filled 2017-08-31: qty 1

## 2017-08-31 MED ORDER — FENTANYL CITRATE (PF) 100 MCG/2ML IJ SOLN
INTRAMUSCULAR | Status: DC | PRN
  Administered 2017-08-31: 25 ug via INTRAVENOUS
  Administered 2017-08-31 (×2): 50 ug via INTRAVENOUS

## 2017-08-31 MED ORDER — BUPIVACAINE LIPOSOME 1.3 % IJ SUSP
INTRAMUSCULAR | Status: AC
  Filled 2017-08-31: qty 20

## 2017-08-31 MED ORDER — OXYCODONE-ACETAMINOPHEN 7.5-325 MG PO TABS: 1.0000 | ORAL_TABLET | Freq: Four times a day (QID) | ORAL | 0 refills | Status: DC | PRN

## 2017-08-31 MED ORDER — EPHEDRINE SULFATE 50 MG/ML IJ SOLN
INTRAMUSCULAR | Status: DC | PRN
  Administered 2017-08-31: 10 mg via INTRAVENOUS

## 2017-08-31 MED ORDER — SUCCINYLCHOLINE CHLORIDE 20 MG/ML IJ SOLN
INTRAMUSCULAR | Status: DC | PRN
  Administered 2017-08-31: 180 mg via INTRAVENOUS

## 2017-08-31 MED ORDER — VANCOMYCIN HCL IN DEXTROSE 1-5 GM/200ML-% IV SOLN
1000.0000 mg | INTRAVENOUS | Status: AC
  Administered 2017-08-31: 1000 mg via INTRAVENOUS

## 2017-08-31 MED ORDER — SUGAMMADEX SODIUM 500 MG/5ML IV SOLN
INTRAVENOUS | Status: DC | PRN
  Administered 2017-08-31: 211.4 mg via INTRAVENOUS

## 2017-08-31 MED ORDER — EPHEDRINE SULFATE 50 MG/ML IJ SOLN
INTRAMUSCULAR | Status: AC
  Filled 2017-08-31: qty 1

## 2017-08-31 MED ORDER — ROCURONIUM BROMIDE 100 MG/10ML IV SOLN
INTRAVENOUS | Status: DC | PRN
  Administered 2017-08-31: 10 mg via INTRAVENOUS

## 2017-08-31 MED ORDER — SODIUM CHLORIDE 0.9 % IJ SOLN
INTRAMUSCULAR | Status: AC
  Filled 2017-08-31: qty 10

## 2017-08-31 MED ORDER — PROPOFOL 10 MG/ML IV BOLUS
INTRAVENOUS | Status: AC
  Filled 2017-08-31: qty 20

## 2017-08-31 MED ORDER — KETOROLAC TROMETHAMINE 30 MG/ML IJ SOLN
30.0000 mg | Freq: Once | INTRAMUSCULAR | Status: AC
  Administered 2017-08-31: 30 mg via INTRAVENOUS

## 2017-08-31 MED ORDER — SODIUM CHLORIDE 0.9 % IR SOLN
Status: DC | PRN
  Administered 2017-08-31: 1000 mL

## 2017-08-31 MED ORDER — SUGAMMADEX SODIUM 500 MG/5ML IV SOLN
INTRAVENOUS | Status: AC
  Filled 2017-08-31: qty 5

## 2017-08-31 MED ORDER — GLYCOPYRROLATE 0.2 MG/ML IJ SOLN
INTRAMUSCULAR | Status: AC
  Filled 2017-08-31: qty 1

## 2017-08-31 MED ORDER — LACTATED RINGERS IV SOLN
INTRAVENOUS | Status: DC
  Administered 2017-08-31: 1000 mL via INTRAVENOUS
  Administered 2017-08-31: 08:00:00 via INTRAVENOUS

## 2017-08-31 MED ORDER — PROPOFOL 10 MG/ML IV BOLUS
INTRAVENOUS | Status: DC | PRN
  Administered 2017-08-31: 160 mg via INTRAVENOUS
  Administered 2017-08-31: 100 mg via INTRAVENOUS
  Administered 2017-08-31: 40 mg via INTRAVENOUS

## 2017-08-31 MED ORDER — FENTANYL CITRATE (PF) 100 MCG/2ML IJ SOLN
25.0000 ug | INTRAMUSCULAR | Status: DC | PRN
  Administered 2017-08-31: 50 ug via INTRAVENOUS
  Filled 2017-08-31: qty 2

## 2017-08-31 MED ORDER — VANCOMYCIN HCL IN DEXTROSE 1-5 GM/200ML-% IV SOLN
INTRAVENOUS | Status: AC
  Filled 2017-08-31: qty 200

## 2017-08-31 MED ORDER — GLYCOPYRROLATE 0.2 MG/ML IJ SOLN
0.2000 mg | Freq: Once | INTRAMUSCULAR | Status: AC
  Administered 2017-08-31: 0.2 mg via INTRAVENOUS

## 2017-08-31 SURGICAL SUPPLY — 37 items
BAG HAMPER (MISCELLANEOUS) ×3 IMPLANT
CLOTH BEACON ORANGE TIMEOUT ST (SAFETY) ×3 IMPLANT
COVER LIGHT HANDLE STERIS (MISCELLANEOUS) ×6 IMPLANT
DERMABOND ADVANCED (GAUZE/BANDAGES/DRESSINGS) ×2
DERMABOND ADVANCED .7 DNX12 (GAUZE/BANDAGES/DRESSINGS) ×1 IMPLANT
DRAIN PENROSE 18X1/2 LTX STRL (DRAIN) ×3 IMPLANT
ELECT REM PT RETURN 9FT ADLT (ELECTROSURGICAL) ×3
ELECTRODE REM PT RTRN 9FT ADLT (ELECTROSURGICAL) ×1 IMPLANT
GLOVE BIOGEL PI IND STRL 6.5 (GLOVE) ×1 IMPLANT
GLOVE BIOGEL PI IND STRL 7.0 (GLOVE) ×2 IMPLANT
GLOVE BIOGEL PI INDICATOR 6.5 (GLOVE) ×2
GLOVE BIOGEL PI INDICATOR 7.0 (GLOVE) ×4
GLOVE SURG SS PI 7.5 STRL IVOR (GLOVE) ×3 IMPLANT
GOWN STRL REUS W/ TWL XL LVL3 (GOWN DISPOSABLE) ×1 IMPLANT
GOWN STRL REUS W/TWL LRG LVL3 (GOWN DISPOSABLE) ×6 IMPLANT
GOWN STRL REUS W/TWL XL LVL3 (GOWN DISPOSABLE) ×2
INST SET MINOR GENERAL (KITS) ×3 IMPLANT
KIT ROOM TURNOVER APOR (KITS) ×3 IMPLANT
MANIFOLD NEPTUNE II (INSTRUMENTS) ×3 IMPLANT
MESH HERNIA 1.6X1.9 PLUG LRG (Mesh General) ×1 IMPLANT
MESH HERNIA PLUG LRG (Mesh General) ×2 IMPLANT
NEEDLE HYPO 21X1.5 SAFETY (NEEDLE) ×3 IMPLANT
NS IRRIG 1000ML POUR BTL (IV SOLUTION) ×3 IMPLANT
PACK MINOR (CUSTOM PROCEDURE TRAY) ×3 IMPLANT
PAD ARMBOARD 7.5X6 YLW CONV (MISCELLANEOUS) ×3 IMPLANT
SET BASIN LINEN APH (SET/KITS/TRAYS/PACK) ×3 IMPLANT
SUT MNCRL AB 4-0 PS2 18 (SUTURE) ×3 IMPLANT
SUT NOVA NAB GS-22 2 2-0 T-19 (SUTURE) ×6 IMPLANT
SUT PROLENE 2 0 SH 30 (SUTURE) IMPLANT
SUT SILK 3 0 (SUTURE)
SUT SILK 3-0 18XBRD TIE 12 (SUTURE) IMPLANT
SUT VIC AB 2-0 CT1 27 (SUTURE) ×2
SUT VIC AB 2-0 CT1 TAPERPNT 27 (SUTURE) ×1 IMPLANT
SUT VIC AB 3-0 SH 27 (SUTURE) ×2
SUT VIC AB 3-0 SH 27X BRD (SUTURE) ×1 IMPLANT
SUT VICRYL AB 3 0 TIES (SUTURE) ×3 IMPLANT
SYR 20CC LL (SYRINGE) ×3 IMPLANT

## 2017-08-31 NOTE — Anesthesia Preprocedure Evaluation (Signed)
Anesthesia Evaluation  Patient identified by MRN, date of birth, ID band Patient awake    Reviewed: Allergy & Precautions, H&P , NPO status , Patient's Chart, lab work & pertinent test results  Airway Mallampati: I  TM Distance: >3 FB Neck ROM: Full    Dental no notable dental hx. (+) Teeth Intact   Pulmonary Current Smoker,    Pulmonary exam normal        Cardiovascular negative cardio ROS Normal cardiovascular exam Rhythm:Regular Rate:Normal     Neuro/Psych negative neurological ROS  negative psych ROS   GI/Hepatic Neg liver ROS, GERD  Controlled,  Endo/Other  negative endocrine ROS  Renal/GU negative Renal ROS     Musculoskeletal negative musculoskeletal ROS (+)   Abdominal   Peds  Hematology negative hematology ROS (+)   Anesthesia Other Findings   Reproductive/Obstetrics                             Anesthesia Physical Anesthesia Plan  ASA: II  Anesthesia Plan: General   Post-op Pain Management:    Induction: Intravenous  PONV Risk Score and Plan:   Airway Management Planned: LMA  Additional Equipment:   Intra-op Plan:   Post-operative Plan: Extubation in OR  Informed Consent: I have reviewed the patients History and Physical, chart, labs and discussed the procedure including the risks, benefits and alternatives for the proposed anesthesia with the patient or authorized representative who has indicated his/her understanding and acceptance.     Plan Discussed with: CRNA  Anesthesia Plan Comments:         Anesthesia Quick Evaluation

## 2017-08-31 NOTE — Anesthesia Procedure Notes (Signed)
Procedure Name: Intubation Date/Time: 08/31/2017 7:46 AM Performed by: Pernell DupreAdams, Trevon Strothers A, CRNA Pre-anesthesia Checklist: Patient identified, Timeout performed, Emergency Drugs available, Suction available and Patient being monitored Patient Re-evaluated:Patient Re-evaluated prior to induction Oxygen Delivery Method: Circle system utilized Preoxygenation: Pre-oxygenation with 100% oxygen Induction Type: IV induction Ventilation: Mask ventilation without difficulty Laryngoscope Size: Miller and 3 Grade View: Grade III Tube type: Oral Number of attempts: 1 Placement Confirmation: ETT inserted through vocal cords under direct vision and positive ETCO2 Secured at: 23 cm Tube secured with: Tape Dental Injury: Teeth and Oropharynx as per pre-operative assessment

## 2017-08-31 NOTE — Transfer of Care (Signed)
Immediate Anesthesia Transfer of Care Note  Patient: Jay FallRicky D Brouillet  Procedure(s) Performed: HERNIA REPAIR INGUINAL ADULT WITH MESH (Right Groin)  Patient Location: PACU  Anesthesia Type:General  Level of Consciousness: awake, alert , oriented and patient cooperative  Airway & Oxygen Therapy: Patient Spontanous Breathing and Patient connected to face mask oxygen  Post-op Assessment: Report given to RN and Post -op Vital signs reviewed and stable  Post vital signs: Reviewed and stable  Last Vitals:  Vitals:   08/31/17 0715 08/31/17 0720  BP: 129/90 132/84  Resp: 19 (!) 28  Temp:    SpO2: 95% 94%    Last Pain:  Vitals:   08/31/17 0642  TempSrc: Oral      Patients Stated Pain Goal: 7 (08/31/17 [id number])  Complications: No apparent anesthesia complications

## 2017-08-31 NOTE — Anesthesia Postprocedure Evaluation (Signed)
Anesthesia Post Note  Patient: Jay Weber  Procedure(s) Performed: HERNIA REPAIR INGUINAL ADULT WITH MESH (Right Groin)  Patient location during evaluation: PACU Anesthesia Type: General Level of consciousness: awake and alert, oriented and patient cooperative Pain management: pain level controlled Vital Signs Assessment: post-procedure vital signs reviewed and stable Respiratory status: spontaneous breathing and respiratory function stable Postop Assessment: no apparent nausea or vomiting Anesthetic complications: no     Last Vitals:  Vitals:   08/31/17 0720 08/31/17 0850  BP: 132/84 (!) 149/102  Pulse:  87  Resp: (!) 28 (!) 21  Temp:  36.5 C  SpO2: 94% 100%    Last Pain:  Vitals:   08/31/17 0642  TempSrc: Oral                 Alaja Goldinger A

## 2017-08-31 NOTE — Discharge Instructions (Signed)
Open Hernia Repair, Adult, Care After °These instructions give you information about caring for yourself after your procedure. Your doctor may also give you more specific instructions. If you have problems or questions, contact your doctor. °Follow these instructions at home: °Surgical cut (incision) care ° °· Follow instructions from your doctor about how to take care of your surgical cut area. Make sure you: °? Wash your hands with soap and water before you change your bandage (dressing). If you cannot use soap and water, use hand sanitizer. °? Change your bandage as told by your doctor. °? Leave stitches (sutures), skin glue, or skin tape (adhesive) strips in place. They may need to stay in place for 2 weeks or longer. If tape strips get loose and curl up, you may trim the loose edges. Do not remove tape strips completely unless your doctor says it is okay. °· Check your surgical cut every day for signs of infection. Check for: °? More redness, swelling, or pain. °? More fluid or blood. °? Warmth. °? Pus or a bad smell. °Activity °· Do not drive or use heavy machinery while taking prescription pain medicine. Do not drive until your doctor says it is okay. °· Until your doctor says it is okay: °? Do not lift anything that is heavier than 10 lb (4.5 kg). °? Do not play contact sports. °· Return to your normal activities as told by your doctor. Ask your doctor what activities are safe. °General instructions °· To prevent or treat having a hard time pooping (constipation) while you are taking prescription pain medicine, your doctor may recommend that you: °? Drink enough fluid to keep your pee (urine) clear or pale yellow. °? Take over-the-counter or prescription medicines. °? Eat foods that are high in fiber, such as fresh fruits and vegetables, whole grains, and beans. °? Limit foods that are high in fat and processed sugars, such as fried and sweet foods. °· Take over-the-counter and prescription medicines only as  told by your doctor. °· Do not take baths, swim, or use a hot tub until your doctor says it is okay. °· Keep all follow-up visits as told by your doctor. This is important. °Contact a doctor if: °· You develop a rash. °· You have more redness, swelling, or pain around your surgical cut. °· You have more fluid or blood coming from your surgical cut. °· Your surgical cut feels warm to the touch. °· You have pus or a bad smell coming from your surgical cut. °· You have a fever or chills. °· You have blood in your poop (stool). °· You have not pooped in 2-3 days. °· Medicine does not help your pain. °Get help right away if: °· You have chest pain or you are short of breath. °· You feel light-headed. °· You feel weak and dizzy (feel faint). °· You have very bad pain. °· You throw up (vomit) and your pain is worse. °This information is not intended to replace advice given to you by your health care provider. Make sure you discuss any questions you have with your health care provider. °Document Released: 09/18/2014 Document Revised: 03/17/2016 Document Reviewed: 02/09/2016 °Elsevier Interactive Patient Education © 2018 Elsevier Inc. ° °

## 2017-08-31 NOTE — Op Note (Signed)
Patient:  Jay Weber  DOB:  10-Jan-1971  MRN:  [id number]   Preop Diagnosis: Right inguinal hernia  Postop Diagnosis: Same  Procedure: Right inguinal herniorrhaphy with mesh  Surgeon: Franky MachoMark Loraine Bhullar, MD  Anes: General endotracheal  Indications: Patient is a 46 year old black male who presents with a symptomatic right inguinal hernia.  The risks and benefits of the procedure including bleeding, infection, mesh use, and the possibility of recurrence of the hernia were fully explained to the patient, who gave informed consent.  Procedure note: The patient was placed in supine position.  After induction of general endotracheal anesthesia, the right groin region was prepped and draped using the usual sterile technique with technic care.  Surgical site confirmation was performed.  Incision was made in the right groin region down to the external oblique aponeurosis.  The aponeurosis was incised to the external ring.  The ileo inguinal nerve was identified and retracted inferiorly from the operative field.  A Penrose drain was placed around the spermatic cord.  The vase deferens was noted within the spermatic cord.  A small lipoma of the cord was found and a high ligation was performed using a 3-0 Vicryl tied.  The indirect hernia sac was present.  This was freed away from the spermatic cord up to the peritoneal reflection and inverted.  A large Bard PerFix plug was then inserted.  An onlay patch was placed along the floor of the inguinal canal and secured superiorly to the conjoined tendon and inferiorly to the shelving edge of Poupart's ligament using 2-0 Novafil interrupted sutures.  The internal ring was re-created using a 2-0 Novafil interrupted suture.  The external oblique aponeurosis was reapproximated using a 2-0 Vicryl running suture.  Subcutaneous layer was reapproximated using a 3-0 Vicryl interrupted suture.  The skin was closed using a 4-0 Monocryl subcuticular suture.  Exparel was  instilled into the surrounding wound.  Dermabond was applied.  All tape needle counts were correct at the end of the procedure.  Patient was extubated in the operating room and transferred to PACU in stable condition.  Complications: None  EBL: Minimal  Specimen: None

## 2017-08-31 NOTE — Anesthesia Procedure Notes (Signed)
Procedure Name: LMA Insertion Date/Time: 08/31/2017 7:33 AM Performed by: Pernell DupreAdams, Barnie Sopko A, CRNA Pre-anesthesia Checklist: Patient identified, Timeout performed, Emergency Drugs available, Suction available and Patient being monitored Patient Re-evaluated:Patient Re-evaluated prior to induction Oxygen Delivery Method: Circle system utilized Preoxygenation: Pre-oxygenation with 100% oxygen Induction Type: IV induction Ventilation: Mask ventilation without difficulty LMA: LMA inserted LMA Size: 4.0 Number of attempts: 1 Placement Confirmation: positive ETCO2 and breath sounds checked- equal and bilateral Dental Injury: Teeth and Oropharynx as per pre-operative assessment

## 2017-08-31 NOTE — Interval H&P Note (Signed)
History and Physical Interval Note:  08/31/2017 7:09 AM  Jay Weber  has presented today for surgery, with the diagnosis of right inguinal hernia  The various methods of treatment have been discussed with the patient and family. After consideration of risks, benefits and other options for treatment, the patient has consented to  Procedure(s) with comments: HERNIA REPAIR INGUINAL ADULT WITH MESH (Right) - patient knows to arrive at 6:15 as a surgical intervention .  The patient's history has been reviewed, patient examined, no change in status, stable for surgery.  I have reviewed the patient's chart and labs.  Questions were answered to the patient's satisfaction.     Franky MachoMark Laquana Villari

## 2017-09-05 ENCOUNTER — Encounter (HOSPITAL_COMMUNITY): Payer: Self-pay | Admitting: General Surgery

## 2017-09-13 ENCOUNTER — Ambulatory Visit (INDEPENDENT_AMBULATORY_CARE_PROVIDER_SITE_OTHER): Payer: Worker's Compensation | Admitting: General Surgery

## 2017-09-13 ENCOUNTER — Encounter: Payer: Self-pay | Admitting: General Surgery

## 2017-09-13 ENCOUNTER — Ambulatory Visit: Payer: Self-pay | Admitting: General Surgery

## 2017-09-13 VITALS — BP 163/98 | HR 88 | Temp 99.6°F | Ht 72.0 in | Wt 231.0 lb

## 2017-09-13 DIAGNOSIS — Z09 Encounter for follow-up examination after completed treatment for conditions other than malignant neoplasm: Secondary | ICD-10-CM

## 2017-09-13 MED ORDER — OXYCODONE-ACETAMINOPHEN 7.5-325 MG PO TABS: 1.0000 | ORAL_TABLET | Freq: Four times a day (QID) | ORAL | 0 refills | Status: AC | PRN

## 2017-09-13 NOTE — Progress Notes (Signed)
Subjective:     Jay Weber  Status post right inguinal herniorrhaphy with mesh.  Is having 5 out of 10 pain.  Pain is made worse with movement.  He states it may be getting slightly better.  He has finished his Percocet. Objective:    BP (!) 163/98   Pulse 88   Temp 99.6 F (37.6 C)   Ht 6' (1.829 m)   Wt 231 lb (104.8 kg)   BMI 31.33 kg/m   General:  alert, cooperative and no distress  Right inguinal incision healing well.  Tenderness noted at the incision.  No seroma present.     Assessment:    Doing well postoperatively. Normal postoperative pain.    Plan:   I told him that he was doing well.  Will reorder his Percocet.  Increase activity as able.  Follow-up here in 2 weeks.

## 2017-09-27 ENCOUNTER — Encounter: Payer: Self-pay | Admitting: General Surgery

## 2017-09-27 ENCOUNTER — Ambulatory Visit (INDEPENDENT_AMBULATORY_CARE_PROVIDER_SITE_OTHER): Payer: Worker's Compensation | Admitting: General Surgery

## 2017-09-27 VITALS — BP 124/91 | HR 102 | Temp 99.3°F | Ht 72.0 in | Wt 235.0 lb

## 2017-09-27 DIAGNOSIS — K4090 Unilateral inguinal hernia, without obstruction or gangrene, not specified as recurrent: Secondary | ICD-10-CM

## 2017-09-27 NOTE — Progress Notes (Signed)
Subjective:     Jay Weber  Status post right inguinal herniorrhaphy with mesh.  Patient states he has no pain at the present time.  He has already returned to work 2 days ago. Objective:    BP (!) 124/91   Pulse (!) 102   Temp 99.3 F (37.4 C)   Ht 6' (1.829 m)   Wt 235 lb (106.6 kg)   BMI 31.87 kg/m   General:  alert, cooperative and no distress  Right inguinal incision well-healed.     Assessment:    Doing well postoperatively.    Plan:   May return to work without restrictions on 09/25/2017.  Follow-up here as needed.

## 2017-10-16 ENCOUNTER — Encounter (HOSPITAL_COMMUNITY): Payer: Self-pay

## 2017-10-16 ENCOUNTER — Emergency Department (HOSPITAL_COMMUNITY)
Admission: EM | Admit: 2017-10-16 | Discharge: 2017-10-16 | Disposition: A | Discharge status: Home / Self Care | Payer: Self-pay | Attending: Emergency Medicine | Admitting: Emergency Medicine

## 2017-10-16 ENCOUNTER — Emergency Department (HOSPITAL_COMMUNITY): Payer: Self-pay

## 2017-10-16 DIAGNOSIS — Z79899 Other long term (current) drug therapy: Secondary | ICD-10-CM | POA: Insufficient documentation

## 2017-10-16 DIAGNOSIS — F17210 Nicotine dependence, cigarettes, uncomplicated: Secondary | ICD-10-CM | POA: Insufficient documentation

## 2017-10-16 DIAGNOSIS — B9789 Other viral agents as the cause of diseases classified elsewhere: Secondary | ICD-10-CM | POA: Insufficient documentation

## 2017-10-16 DIAGNOSIS — J069 Acute upper respiratory infection, unspecified: Secondary | ICD-10-CM | POA: Insufficient documentation

## 2017-10-16 MED ORDER — IBUPROFEN 800 MG PO TABS
800.0000 mg | ORAL_TABLET | Freq: Once | ORAL | Status: AC
  Administered 2017-10-16: 800 mg via ORAL
  Filled 2017-10-16: qty 1

## 2017-10-16 MED ORDER — DEXAMETHASONE 4 MG PO TABS: 4.0000 mg | ORAL_TABLET | Freq: Two times a day (BID) | ORAL | 0 refills | Status: DC

## 2017-10-16 MED ORDER — PSEUDOEPHEDRINE HCL 60 MG PO TABS
60.0000 mg | ORAL_TABLET | Freq: Once | ORAL | Status: AC
  Administered 2017-10-16: 60 mg via ORAL
  Filled 2017-10-16: qty 1

## 2017-10-16 MED ORDER — DEXAMETHASONE SODIUM PHOSPHATE 10 MG/ML IJ SOLN
10.0000 mg | Freq: Once | INTRAMUSCULAR | Status: AC
  Administered 2017-10-16: 10 mg via INTRAMUSCULAR
  Filled 2017-10-16: qty 1

## 2017-10-16 NOTE — ED Triage Notes (Signed)
Pt reports cough and congestion x 3 days.  Denies fever.  Pt hoarse.

## 2017-10-16 NOTE — Discharge Instructions (Addendum)
Please wash hands frequently.  Please wipe down surfaces.  Salt water gargles may be helpful.  Chloraseptic spray will also be helpful.  Use Sudafed, or the decongesting medication of your choice.  Use Decadron 2 times daily with food.  Clear liquids will be more comfortable for now, gradually work your way into more solid foods.  Please see your primary physician, or return to the emergency department if any changes in your condition, problems, or concerns.

## 2017-10-16 NOTE — ED Provider Notes (Addendum)
[facility] EMERGENCY DEPARTMENT Provider Note   CSN: [id number] Arrival date & time: 10/16/17  1191     History   Chief Complaint Chief Complaint  Patient presents with  . Cough    HPI Jay Weber is a 47 y.o. male.  Multiple co-workers have recently been sick with flu-like illness.   The history is provided by the patient.  URI   The current episode started more than 2 days ago. There has been no fever. Associated symptoms include chest pain, diarrhea, nausea, congestion, headaches, sore throat and cough. Pertinent negatives include no abdominal pain, no vomiting, no dysuria, no neck pain, no rash and no wheezing. He has tried nothing for the symptoms.    Past Medical History:  Diagnosis Date  . GERD (gastroesophageal reflux disease)   . Hernia     Patient Active Problem List   Diagnosis Date Noted  . Non-recurrent unilateral inguinal hernia without obstruction or gangrene     Past Surgical History:  Procedure Laterality Date  . INGUINAL HERNIA REPAIR  12/22/2011   Procedure: HERNIA REPAIR INGUINAL ADULT;  Surgeon: Fabio Bering, MD;  Location: [facility];  Service: General;  Laterality: Left;  . INGUINAL HERNIA REPAIR Right 08/31/2017   Procedure: HERNIA REPAIR INGUINAL ADULT WITH MESH;  Surgeon: Franky Macho, MD;  Location: [facility];  Service: General;  Laterality: Right;  patient knows to arrive at 6:15  . KNEE SURGERY  1991   Knee injury; [facility] Medications    Prior to Admission medications   Medication Sig Start Date End Date Taking? Authorizing Provider  EPINEPHrine (EPI-PEN) 0.3 mg/0.3 mL DEVI Inject 0.3 mg into the muscle as needed. For allergic reactions    [provider]  naproxen (NAPROSYN) 500 MG tablet Take 1 tablet (500 mg total) by mouth 2 (two) times daily. 08/18/13   Vanetta Mulders, MD  oxyCODONE-acetaminophen (PERCOCET) 7.5-325 MG tablet Take 1 tablet by mouth every 6 (six) hours as needed for severe pain. 09/13/17  09/13/18  Franky Macho, MD    Family History No family history on file.  Social History Social History   Tobacco Use  . Smoking status: Current Every Day Smoker    Packs/day: 0.50    Years: 27.00    Pack years: 13.50    Types: Cigarettes  . Smokeless tobacco: Never Used  Substance Use Topics  . Alcohol use: Yes    Comment: Occassionally  . Drug use: No     Allergies   Bee venom; Penicillins; and Shellfish allergy   Review of Systems Review of Systems  Constitutional: Negative for activity change.       All ROS Neg except as noted in HPI  HENT: Positive for congestion and sore throat. Negative for nosebleeds.   Eyes: Negative for photophobia and discharge.  Respiratory: Positive for cough. Negative for shortness of breath and wheezing.   Cardiovascular: Positive for chest pain. Negative for palpitations.  Gastrointestinal: Positive for diarrhea and nausea. Negative for abdominal pain, blood in stool and vomiting.  Genitourinary: Negative for dysuria, frequency and hematuria.  Musculoskeletal: Positive for myalgias. Negative for arthralgias, back pain and neck pain.  Skin: Negative.  Negative for rash.  Neurological: Positive for headaches. Negative for dizziness, seizures and speech difficulty.  Psychiatric/Behavioral: Negative for confusion and hallucinations.     Physical Exam Updated Vital Signs BP (!) 136/98 (BP Location: Right Arm)   Pulse 71   Temp 98.4 F (36.9 C) (  Oral)   Resp 18   Ht 6' (1.829 m)   Wt 106.6 kg (235 lb)   SpO2 97%   BMI 31.87 kg/m   Physical Exam  Constitutional: He is oriented to person, place, and time. He appears well-developed and well-nourished.  Non-toxic appearance.  HENT:  Head: Normocephalic.  Right Ear: Tympanic membrane and external ear normal.  Left Ear: Tympanic membrane and external ear normal.  Nasal congestion present.  Eyes: EOM and lids are normal. Pupils are equal, round, and reactive to light.  Neck: Normal  range of motion. Neck supple. Carotid bruit is not present.  Cardiovascular: Normal rate, regular rhythm, normal heart sounds, intact distal pulses and normal pulses.  Few scattered rhonchi  Pulmonary/Chest: Breath sounds normal. No respiratory distress.  Abdominal: Soft. Bowel sounds are normal. There is no tenderness. There is no guarding.  Musculoskeletal: Normal range of motion.  Lymphadenopathy:       Head (right side): No submandibular adenopathy present.       Head (left side): No submandibular adenopathy present.    He has no cervical adenopathy.  Neurological: He is alert and oriented to person, place, and time. He has normal strength. No cranial nerve deficit or sensory deficit.  Skin: Skin is warm and dry.  Psychiatric: He has a normal mood and affect. His speech is normal.  Nursing note and vitals reviewed.    ED Treatments / Results  Labs (all labs ordered are listed, but only abnormal results are displayed) Labs Reviewed - No data to display  EKG  EKG Interpretation None       Radiology Dg Chest 2 View  Result Date: 10/16/2017 CLINICAL DATA:  Cough for 3 days.  Smoker. EXAM: CHEST  2 VIEW COMPARISON:  None. FINDINGS: Midline trachea.  Normal heart size and mediastinal contours. Sharp costophrenic angles.  No pneumothorax.  Clear lungs. IMPRESSION: No active cardiopulmonary disease. Electronically Signed   By: Jeronimo Greaves M.D.   On: 10/16/2017 10:25    Procedures Procedures (including critical care time)  Medications Ordered in ED Medications - No data to display   Initial Impression / Assessment and Plan / ED Course  I have reviewed the triage vital signs and the nursing notes.  Pertinent labs & imaging results that were available during my care of the patient were reviewed by me and considered in my medical decision making (see chart for details).       Final Clinical Impressions(s) / ED Diagnoses  MDM Patient's blood pressure is slightly elevated  at 136/98, otherwise vital signs are within normal limits.  Pulse oximetry is 97% on room air.  Within normal limits by my interpretation.  Chest x-ray shows no active cardiopulmonary disease.  The examination favors an upper respiratory infection with cough.  I have asked the patient to use salt water gargles and Chloraseptic for his sore throat.  Also asked him to observe on voice rest.  The patient will use Tylenol every 4 hours, or ibuprofen every 6 hours for fever or aching.  We discussed the importance of good handwashing and good hydration.  The patient will use Decadron 2 times daily with food.  He will see his primary pediatrician or return to the emergency department if any changes in his condition, problems or concerns.    Final diagnoses:  Viral URI with cough    ED Discharge Orders        Ordered    dexamethasone (DECADRON) 4 MG tablet  2 times  daily with meals     10/16/17 1140       Ivery QualeBryant, Venda Dice, [place]-C 10/16/17 1148    Raeford RazorKohut, Stephen, MD 10/16/17 1441    Ivery QualeBryant, Lyndel Sarate, PA-C 11/08/17 1447    Raeford RazorKohut, Stephen, MD 11/09/17 [id number]

## 2020-04-14 ENCOUNTER — Encounter (HOSPITAL_COMMUNITY): Payer: Self-pay

## 2020-04-14 ENCOUNTER — Emergency Department (HOSPITAL_COMMUNITY)
Admission: EM | Admit: 2020-04-14 | Discharge: 2020-04-14 | Disposition: A | Discharge status: Home / Self Care | Payer: BC Managed Care – PPO | Attending: Emergency Medicine | Admitting: Emergency Medicine

## 2020-04-14 ENCOUNTER — Emergency Department (HOSPITAL_COMMUNITY): Payer: BC Managed Care – PPO

## 2020-04-14 ENCOUNTER — Other Ambulatory Visit: Payer: Self-pay

## 2020-04-14 DIAGNOSIS — F17210 Nicotine dependence, cigarettes, uncomplicated: Secondary | ICD-10-CM | POA: Diagnosis not present

## 2020-04-14 DIAGNOSIS — M25511 Pain in right shoulder: Secondary | ICD-10-CM

## 2020-04-14 DIAGNOSIS — M19011 Primary osteoarthritis, right shoulder: Secondary | ICD-10-CM | POA: Insufficient documentation

## 2020-04-14 MED ORDER — PREDNISONE 10 MG PO TABS: ORAL_TABLET | ORAL | 0 refills | Status: DC

## 2020-04-14 NOTE — ED Provider Notes (Signed)
[facility] EMERGENCY DEPARTMENT Provider Note   CSN: [id number] Arrival date & time: 04/14/20  1030     History Chief Complaint  Patient presents with  . Shoulder Pain    Jay Weber is a 49 y.o. male.  HPI      Jay Weber is a 49 y.o. male who presents to the Emergency Department complaining of right shoulder pain since yesterday.  He endorses heavy lifting and frequent use of his arms at his job, but denies known injury.  He describes a sharp pain to his shoulder when he abducts his right arm.  Pain radiates into his elbow and the right side of his neck.  Pain resolves at rest.  No neck pain when right arm is relaxed. Denies chest pain, shortness of breath, rash, fever and chills.    Past Medical History:  Diagnosis Date  . GERD (gastroesophageal reflux disease)   . Hernia     Patient Active Problem List   Diagnosis Date Noted  . Non-recurrent unilateral inguinal hernia without obstruction or gangrene     Past Surgical History:  Procedure Laterality Date  . INGUINAL HERNIA REPAIR  12/22/2011   Procedure: HERNIA REPAIR INGUINAL ADULT;  Surgeon: Fabio Bering, MD;  Location: [facility];  Service: General;  Laterality: Left;  . INGUINAL HERNIA REPAIR Right 08/31/2017   Procedure: HERNIA REPAIR INGUINAL ADULT WITH MESH;  Surgeon: Franky Macho, MD;  Location: [facility];  Service: General;  Laterality: Right;  patient knows to arrive at 6:15  . KNEE SURGERY  1991   Knee injury; [place]       No family history on file.  Social History   Tobacco Use  . Smoking status: Current Every Day Smoker    Packs/day: 0.50    Years: 27.00    Pack years: 13.50    Types: Cigarettes  . Smokeless tobacco: Never Used  Substance Use Topics  . Alcohol use: Yes    Comment: Occassionally  . Drug use: No    Home Medications Prior to Admission medications   Medication Sig Start Date End Date Taking? Authorizing Provider  dexamethasone (DECADRON) 4 MG tablet Take 1 tablet  (4 mg total) by mouth 2 (two) times daily with a meal. 10/16/17   Ivery Quale, PA-C  EPINEPHrine (EPI-PEN) 0.3 mg/0.3 mL DEVI Inject 0.3 mg into the muscle as needed. For allergic reactions    [provider]  naproxen (NAPROSYN) 500 MG tablet Take 1 tablet (500 mg total) by mouth 2 (two) times daily. 08/18/13   Vanetta Mulders, MD    Allergies    Bee venom, Penicillins, Shellfish allergy, and Benadryl [diphenhydramine]  Review of Systems   Review of Systems  Constitutional: Negative for chills and fever.  Respiratory: Negative for chest tightness and shortness of breath.   Cardiovascular: Negative for chest pain.  Genitourinary: Negative for difficulty urinating and dysuria.  Musculoskeletal: Positive for arthralgias (right shoulder pain). Negative for joint swelling and neck pain.  Skin: Negative for color change and rash.  Neurological: Negative for weakness and numbness.    Physical Exam Updated Vital Signs BP 135/86 (BP Location: Left Arm)   Pulse 98   Temp 98 F (36.7 C) (Oral)   Resp 18   Ht 6' (1.829 m)   Wt 90.7 kg   SpO2 99%   BMI 27.12 kg/m   Physical Exam Vitals and nursing note reviewed.  Constitutional:      General: He is not in acute distress.  Appearance: Normal appearance. He is not ill-appearing.  Neck:     Thyroid: No thyromegaly.     Meningeal: Kernig's sign absent.  Cardiovascular:     Rate and Rhythm: Normal rate and regular rhythm.     Pulses: Normal pulses.  Pulmonary:     Effort: Pulmonary effort is normal.     Breath sounds: Normal breath sounds. No wheezing.  Chest:     Chest wall: No tenderness.  Abdominal:     Palpations: Abdomen is soft.     Tenderness: There is no abdominal tenderness. There is no guarding or rebound.  Musculoskeletal:        General: Tenderness present. No signs of injury. Normal range of motion.     Cervical back: Normal range of motion and neck supple.     Comments: Tenderness at right [facility] joint.   Pain with abduction.  No edema, erythema or excessive warmth.  Cervical spine non tender. Grip strength strong and symmetrical  Skin:    General: Skin is warm.     Capillary Refill: Capillary refill takes less than 2 seconds.     Findings: No rash.  Neurological:     General: No focal deficit present.     Mental Status: He is alert.     Sensory: Sensation is intact. No sensory deficit.     Motor: Motor function is intact. No weakness.     Coordination: Coordination is intact.     ED Results / Procedures / Treatments   Labs (all labs ordered are listed, but only abnormal results are displayed) Labs Reviewed - No data to display  EKG None  Radiology DG Shoulder Right  Result Date: 04/14/2020 CLINICAL DATA:  Pain EXAM: RIGHT SHOULDER - 2+ VIEW COMPARISON:  None. FINDINGS: Oblique, Y scapular, and axillary images obtained. No evident fracture or dislocation. There is narrowing of the acromioclavicular joint. Glenohumeral joint appears normal. No erosive change. Visualized right lung clear. IMPRESSION: Joint space narrowing consistent with a degree of osteoarthritic change in the acromioclavicular joint. The glenohumeral joint appears normal. No fracture or dislocation. Electronically Signed   By: Bretta Bang III M.D.   On: 04/14/2020 11:06    Procedures Procedures (including critical care time)  Medications Ordered in ED Medications - No data to display  ED Course  I have reviewed the triage vital signs and the nursing notes.  Pertinent labs & imaging results that were available during my care of the patient were reviewed by me and considered in my medical decision making (see chart for details).    MDM Rules/Calculators/A&P                          Pt with focal tenderness at [facility] joint of right shoulder.  No known injury but admits to frequent, heavy lifting at his job.  No cervical tenderness, no chest pain.  NV intact.  No concerning sx's for septic joint.  XR of the  shoulder shows OA of the [facility] joint with joint space narrowing.    Pt appears appropriate for d/c home, close orthopedic f/u if not improving.     Final Clinical Impression(s) / ED Diagnoses Final diagnoses:  Acute pain of right shoulder  Osteoarthritis of right shoulder, unspecified osteoarthritis type    Rx / DC Orders ED Discharge Orders         Ordered    predniSONE (DELTASONE) 10 MG tablet     Discontinue  Reprint  04/14/20 1158           Pauline Aus, PA-C 04/16/20 2254    Terrilee Files, MD 04/17/20 [phone]

## 2020-04-14 NOTE — Discharge Instructions (Signed)
Apply warm compresses on and off to your shoulder.  Avoid excessive use or heavy lifting for at least 1 week.  Call the orthopedic provider listed to arrange a follow-up appointment in 1 week if not improving.

## 2020-04-14 NOTE — ED Triage Notes (Signed)
Pt reports r shoulder pain since yesterday.  Reports does some lifting at work but other than that, doesn't know of any injury.  Pt says hurts to raise r shoulder.

## 2020-04-27 ENCOUNTER — Encounter: Payer: Self-pay | Admitting: Orthopedic Surgery

## 2020-04-27 ENCOUNTER — Other Ambulatory Visit: Payer: Self-pay

## 2020-04-27 ENCOUNTER — Ambulatory Visit (INDEPENDENT_AMBULATORY_CARE_PROVIDER_SITE_OTHER): Payer: BC Managed Care – PPO | Admitting: Orthopedic Surgery

## 2020-04-27 VITALS — BP 208/97 | HR 90 | Ht 72.0 in | Wt 200.0 lb

## 2020-04-27 DIAGNOSIS — M25511 Pain in right shoulder: Secondary | ICD-10-CM

## 2020-04-27 MED ORDER — IBUPROFEN 800 MG PO TABS: 800.0000 mg | ORAL_TABLET | Freq: Three times a day (TID) | ORAL | 1 refills | Status: DC | PRN

## 2020-04-27 MED ORDER — TIZANIDINE HCL 4 MG PO TABS: 4.0000 mg | ORAL_TABLET | Freq: Every day | ORAL | 1 refills | Status: DC

## 2020-04-27 MED ORDER — GABAPENTIN 400 MG PO CAPS: 400.0000 mg | ORAL_CAPSULE | Freq: Every day | ORAL | 1 refills | Status: AC

## 2020-04-27 NOTE — Progress Notes (Signed)
NEW PROBLEM//OFFICE VISIT  Chief Complaint  Patient presents with   Shoulder Pain    right/ went to ER on 04/14/20 pain became more severe    Posterior shoulder pain near shoulder blade x 10 days no trauma.  Seems he does a lot of work with his upper extremities on the job and does a lot of reaching out to his right side.  He had pain in his shoulder which appeared to be more rotator cuff years ago but this pain is more on the superior angle of the right scapula  He tried a steroid Dosepak it did not help.  He was in the emergency room x-rays were taken at that time I will look at those and comment below       Review of Systems  Constitutional: Negative for chills and fever.  Neurological: Negative for tingling.     Past Medical History:  Diagnosis Date   GERD (gastroesophageal reflux disease)    Hernia     Past Surgical History:  Procedure Laterality Date   INGUINAL HERNIA REPAIR  12/22/2011   Procedure: HERNIA REPAIR INGUINAL ADULT;  Surgeon: Fabio Bering, MD;  Location: [facility];  Service: General;  Laterality: Left;   INGUINAL HERNIA REPAIR Right 08/31/2017   Procedure: HERNIA REPAIR INGUINAL ADULT WITH MESH;  Surgeon: Franky Macho, MD;  Location: [facility];  Service: General;  Laterality: Right;  patient knows to arrive at 6:15   KNEE SURGERY  1991   Knee injury; [place]    History reviewed. No pertinent family history. Social History   Tobacco Use   Smoking status: Current Every Day Smoker    Packs/day: 0.50    Years: 27.00    Pack years: 13.50    Types: Cigarettes   Smokeless tobacco: Never Used  Substance Use Topics   Alcohol use: Yes    Comment: Occassionally   Drug use: No    Allergies  Allergen Reactions   Bee Venom Anaphylaxis   Penicillins Anaphylaxis   Shellfish Allergy Anaphylaxis   Benadryl [Diphenhydramine]     Current Meds  Medication Sig   predniSONE (DELTASONE) 10 MG tablet Take 6 tablets day one, 5 tablets day two, 4  tablets day three, 3 tablets day four, 2 tablets day five, then 1 tablet day six    BP (!) 208/97    Pulse 90    Ht 6' (1.829 m)    Wt 200 lb (90.7 kg)    BMI 27.12 kg/m   Physical Exam Constitutional:      General: He is not in acute distress.    Appearance: He is well-developed.  Cardiovascular:     Comments: No peripheral edema Skin:    General: Skin is warm and dry.  Neurological:     Mental Status: He is alert and oriented to person, place, and time.     Sensory: No sensory deficit.     Coordination: Coordination normal.     Gait: Gait normal.     Deep Tendon Reflexes: Reflexes are normal and symmetric.     Ortho Exam  Full range of motion right shoulder no instability cuff strength normal tenderness at the superior angle of the scapula with some crepitance no neck pain or tenderness  MEDICAL DECISION MAKING  A.  Encounter Diagnosis  Name Primary?   Trigger point of right shoulder region Yes    B. DATA ANALYSED:    IMAGING: Independent interpretation of images: 3 views right shoulder he does have some  calcification/narrowing of the [facility] joint but it is not in the area of tenderness  Orders: PT with dry needling  Outside records reviewed: ER records  C. MANAGEMENT   PHYS THERAPY WITH DRY NEEDLING     Meds ordered this encounter  Medications   ibuprofen (ADVIL) 800 MG tablet    Sig: Take 1 tablet (800 mg total) by mouth every 8 (eight) hours as needed.    Dispense:  90 tablet    Refill:  1   gabapentin (NEURONTIN) 400 MG capsule    Sig: Take 1 capsule (400 mg total) by mouth daily.    Dispense:  30 capsule    Refill:  1   tiZANidine (ZANAFLEX) 4 MG tablet    Sig: Take 1 tablet (4 mg total) by mouth daily.    Dispense:  30 tablet    Refill:  1    Follow-up 8 weeks  Fuller Canada, MD  04/27/2020 2:43 PM

## 2020-04-27 NOTE — Patient Instructions (Signed)
SCHEDULE PT  RTW Sunday

## 2020-05-06 ENCOUNTER — Telehealth: Payer: Self-pay | Admitting: Orthopedic Surgery

## 2020-05-06 NOTE — Telephone Encounter (Signed)
Called patient to notify of forms received in fax from [employer]; discussed forms process - voiced understanding of fee and [employer] authorization forms to be completed.

## 2020-06-03 ENCOUNTER — Ambulatory Visit (HOSPITAL_COMMUNITY): Payer: BC Managed Care – PPO | Attending: Orthopedic Surgery | Admitting: Physical Therapy

## 2020-06-17 ENCOUNTER — Ambulatory Visit (HOSPITAL_COMMUNITY): Payer: BC Managed Care – PPO | Attending: Orthopedic Surgery | Admitting: Physical Therapy

## 2020-06-24 ENCOUNTER — Ambulatory Visit: Payer: BC Managed Care – PPO | Admitting: Orthopedic Surgery

## 2020-06-24 ENCOUNTER — Encounter: Payer: Self-pay | Admitting: Orthopedic Surgery

## 2020-07-06 ENCOUNTER — Ambulatory Visit (HOSPITAL_COMMUNITY): Payer: BC Managed Care – PPO | Admitting: Physical Therapy

## 2020-12-08 ENCOUNTER — Encounter (HOSPITAL_COMMUNITY): Payer: Self-pay | Admitting: Emergency Medicine

## 2020-12-08 ENCOUNTER — Emergency Department (HOSPITAL_COMMUNITY): Payer: BC Managed Care – PPO

## 2020-12-08 ENCOUNTER — Other Ambulatory Visit: Payer: Self-pay

## 2020-12-08 ENCOUNTER — Emergency Department (HOSPITAL_COMMUNITY)
Admission: EM | Admit: 2020-12-08 | Discharge: 2020-12-08 | Disposition: A | Discharge status: Home / Self Care | Payer: BC Managed Care – PPO | Attending: Emergency Medicine | Admitting: Emergency Medicine

## 2020-12-08 DIAGNOSIS — M545 Low back pain, unspecified: Secondary | ICD-10-CM | POA: Diagnosis present

## 2020-12-08 DIAGNOSIS — R202 Paresthesia of skin: Secondary | ICD-10-CM | POA: Insufficient documentation

## 2020-12-08 DIAGNOSIS — M5441 Lumbago with sciatica, right side: Secondary | ICD-10-CM | POA: Insufficient documentation

## 2020-12-08 DIAGNOSIS — M5442 Lumbago with sciatica, left side: Secondary | ICD-10-CM | POA: Diagnosis not present

## 2020-12-08 DIAGNOSIS — F17210 Nicotine dependence, cigarettes, uncomplicated: Secondary | ICD-10-CM | POA: Diagnosis not present

## 2020-12-08 MED ORDER — TRAMADOL HCL 50 MG PO TABS: 50.0000 mg | ORAL_TABLET | Freq: Four times a day (QID) | ORAL | 0 refills | Status: AC | PRN

## 2020-12-08 MED ORDER — PREDNISONE 10 MG PO TABS: ORAL_TABLET | ORAL | 0 refills | Status: AC

## 2020-12-08 MED ORDER — METHOCARBAMOL 500 MG PO TABS: 500.0000 mg | ORAL_TABLET | Freq: Three times a day (TID) | ORAL | 0 refills | Status: AC

## 2020-12-08 NOTE — ED Notes (Signed)
Pt to xray

## 2020-12-08 NOTE — ED Triage Notes (Signed)
Pt c/o lower back pain with pins and needles to Bilateral legs.

## 2020-12-08 NOTE — Discharge Instructions (Signed)
Alternate ice and heat to your lower back.  Avoid bending, twisting or heavy lifting for at least 1 week.  Follow-up with your primary doctor within the week if your symptoms are not improving.

## 2020-12-08 NOTE — ED Provider Notes (Signed)
[facility] EMERGENCY DEPARTMENT Provider Note   CSN: [id number] Arrival date & time: 12/08/20  1527     History Chief Complaint  Patient presents with  . Back Pain    Jay Weber is a 50 y.o. male.  HPI      Jay Weber is a 50 y.o. male who presents to the Emergency Department complaining of mid low back pain x4 days.  He describes having gradually worsening pain of his mid lower back with stinging, aching pain into his bilateral thighs.  Pain is associated with standing and walking.  Denies any pain at rest.   Pain does not radiate below the level of his knees.  Symptoms improve when leaning forward and worsen when standing erect.  No known injury or heavy lifting.  He has tried topical muscle creams without relief.  States that if he walks any distance he feels as though his legs are going to "give way."  He denies fever, chills, abdominal pain, urine or bowel changes, recent spinal procedures and numbness of the lower extremities.   Past Medical History:  Diagnosis Date  . GERD (gastroesophageal reflux disease)   . Hernia     Patient Active Problem List   Diagnosis Date Noted  . Non-recurrent unilateral inguinal hernia without obstruction or gangrene     Past Surgical History:  Procedure Laterality Date  . INGUINAL HERNIA REPAIR  12/22/2011   Procedure: HERNIA REPAIR INGUINAL ADULT;  Surgeon: Fabio Bering, MD;  Location: [facility];  Service: General;  Laterality: Left;  . INGUINAL HERNIA REPAIR Right 08/31/2017   Procedure: HERNIA REPAIR INGUINAL ADULT WITH MESH;  Surgeon: Franky Macho, MD;  Location: [facility];  Service: General;  Laterality: Right;  patient knows to arrive at 6:15  . KNEE SURGERY  1991   Knee injury; [place]       History reviewed. No pertinent family history.  Social History   Tobacco Use  . Smoking status: Current Every Day Smoker    Packs/day: 0.50    Years: 27.00    Pack years: 13.50    Types: Cigarettes  . Smokeless tobacco:  Never Used  Vaping Use  . Vaping Use: Never used  Substance Use Topics  . Alcohol use: Yes    Comment: Occassionally  . Drug use: No    Home Medications Prior to Admission medications   Medication Sig Start Date End Date Taking? Authorizing Provider  EPINEPHrine (EPI-PEN) 0.3 mg/0.3 mL DEVI Inject 0.3 mg into the muscle as needed. For allergic reactions Patient not taking: Reported on 04/27/2020    [provider]  gabapentin (NEURONTIN) 400 MG capsule Take 1 capsule (400 mg total) by mouth daily. 04/27/20   Vickki Hearing, MD  ibuprofen (ADVIL) 800 MG tablet Take 1 tablet (800 mg total) by mouth every 8 (eight) hours as needed. 04/27/20   Vickki Hearing, MD  naproxen (NAPROSYN) 500 MG tablet Take 1 tablet (500 mg total) by mouth 2 (two) times daily. Patient not taking: Reported on 04/27/2020 08/18/13   Vanetta Mulders, MD  predniSONE (DELTASONE) 10 MG tablet Take 6 tablets day one, 5 tablets day two, 4 tablets day three, 3 tablets day four, 2 tablets day five, then 1 tablet day six 04/14/20   Antasia Haider, PA-C  tiZANidine (ZANAFLEX) 4 MG tablet Take 1 tablet (4 mg total) by mouth daily. 04/27/20 04/27/21  Vickki Hearing, MD    Allergies    Bee venom, Benadryl [diphenhydramine], Penicillins, and  Shellfish allergy  Review of Systems   Review of Systems  Constitutional: Negative for fever.  Respiratory: Negative for shortness of breath.   Cardiovascular: Negative for chest pain.  Gastrointestinal: Negative for abdominal pain, constipation, diarrhea, nausea and vomiting.  Genitourinary: Negative for decreased urine volume, difficulty urinating, dysuria, flank pain, penile pain, penile swelling and testicular pain.  Musculoskeletal: Positive for back pain. Negative for joint swelling.  Skin: Negative for rash.  Neurological: Positive for numbness ("pins and needle" sensations to bilateral thighs). Negative for dizziness, weakness and headaches.    Physical  Exam Updated Vital Signs BP 111/65 (BP Location: Right Arm)   Pulse 98   Temp 98.3 F (36.8 C) (Oral)   Resp 16   Ht 6' (1.829 m)   Wt 99.2 kg   SpO2 97%   BMI 29.67 kg/m   Physical Exam Vitals and nursing note reviewed.  Constitutional:      General: He is not in acute distress.    Appearance: Normal appearance. He is not toxic-appearing.  Neck:     Thyroid: No thyromegaly.     Meningeal: Kernig's sign absent.  Cardiovascular:     Rate and Rhythm: Normal rate and regular rhythm.     Pulses: Normal pulses.  Pulmonary:     Effort: Pulmonary effort is normal.     Breath sounds: Normal breath sounds. No wheezing.  Abdominal:     Palpations: Abdomen is soft.     Tenderness: There is no abdominal tenderness. There is no right CVA tenderness, left CVA tenderness or guarding.  Musculoskeletal:        General: Tenderness present. Normal range of motion.     Right lower leg: No edema.     Left lower leg: No edema.     Comments: ttp of the lower lumbar spine and bilateral lumbar paraspinal muscles.    Skin:    General: Skin is warm.     Capillary Refill: Capillary refill takes less than 2 seconds.     Findings: No rash.  Neurological:     General: No focal deficit present.     Mental Status: He is alert.     Sensory: Sensation is intact. No sensory deficit.     Motor: Motor function is intact. No weakness.     Coordination: Coordination is intact.     Gait: Gait is intact.     ED Results / Procedures / Treatments   Labs (all labs ordered are listed, but only abnormal results are displayed) Labs Reviewed - No data to display  EKG None  Radiology DG Lumbar Spine Complete  Result Date: 12/08/2020 CLINICAL DATA:  Bilateral lower back pain for 4 days EXAM: LUMBAR SPINE - COMPLETE 4+ VIEW COMPARISON:  None. FINDINGS: Frontal, bilateral oblique, lateral views of the lumbar spine are obtained. There are 5 non-rib-bearing lumbar type vertebral bodies in grossly normal  alignment. There is severe spondylosis at L3-4 and L4-5, with associated facet hypertrophic changes. There are no acute displaced fractures. Sacroiliac joints are unremarkable. IMPRESSION: 1. Prominent lower lumbar spondylosis and facet hypertrophy. No acute fracture. Electronically Signed   By: Sharlet Salina M.D.   On: 12/08/2020 16:27    Procedures Procedures   Medications Ordered in ED Medications - No data to display  ED Course  I have reviewed the triage vital signs and the nursing notes.  Pertinent labs & imaging results that were available during my care of the patient were reviewed by me and considered in my  medical decision making (see chart for details).    MDM Rules/Calculators/A&P                          patient here with low back pain and tingling sensation of bilateral LE's.  No reported trauma or recent heavy lifting.  Gait steady.  No focal neuro deficits on exam.  No red flags that are suggestive of cauda equina. He is well appearing and vitals reassuring.  No recent imaging, sx's suggestive of spinal stenosis.  Will obtain XR.   L spine film results reviewed by me, significant spondylosis of L3-4 and 4-5, no fx.  Pt agreeable to symptomatic treatment and outpt f/u if not improving.  Return precautions discussed.   The patient appears reasonably screened and/or stabilized for discharge and I doubt any other medical condition or other [facility] requiring further screening, evaluation, or treatment in the ED at this time prior to discharge.    Final Clinical Impression(s) / ED Diagnoses Final diagnoses:  Acute midline low back pain with bilateral sciatica    Rx / DC Orders ED Discharge Orders    None       Pauline Aus, PA-C 12/08/20 1709    Linwood Dibbles, MD 12/09/20 1459

## 2021-02-16 ENCOUNTER — Emergency Department (HOSPITAL_COMMUNITY): Payer: BC Managed Care – PPO

## 2021-02-16 ENCOUNTER — Encounter (HOSPITAL_COMMUNITY): Payer: Self-pay | Admitting: Emergency Medicine

## 2021-02-16 ENCOUNTER — Other Ambulatory Visit: Payer: Self-pay

## 2021-02-16 ENCOUNTER — Emergency Department (HOSPITAL_COMMUNITY)
Admission: EM | Admit: 2021-02-16 | Discharge: 2021-02-16 | Disposition: A | Discharge status: Home / Self Care | Payer: BC Managed Care – PPO | Attending: Emergency Medicine | Admitting: Emergency Medicine

## 2021-02-16 DIAGNOSIS — R000 Tachycardia, unspecified: Secondary | ICD-10-CM | POA: Insufficient documentation

## 2021-02-16 DIAGNOSIS — R0789 Other chest pain: Secondary | ICD-10-CM | POA: Insufficient documentation

## 2021-02-16 DIAGNOSIS — K219 Gastro-esophageal reflux disease without esophagitis: Secondary | ICD-10-CM | POA: Insufficient documentation

## 2021-02-16 DIAGNOSIS — F17210 Nicotine dependence, cigarettes, uncomplicated: Secondary | ICD-10-CM | POA: Diagnosis not present

## 2021-02-16 DIAGNOSIS — R079 Chest pain, unspecified: Secondary | ICD-10-CM

## 2021-02-16 DIAGNOSIS — E876 Hypokalemia: Secondary | ICD-10-CM

## 2021-02-16 LAB — COMPREHENSIVE METABOLIC PANEL
ALT: 29 U/L (ref 0–44)
AST: 52 U/L — ABNORMAL HIGH (ref 15–41)
Albumin: 4 g/dL (ref 3.5–5.0)
Alkaline Phosphatase: 66 U/L (ref 38–126)
Anion gap: 7 (ref 5–15)
BUN: 5 mg/dL — ABNORMAL LOW (ref 6–20)
CO2: 24 mmol/L (ref 22–32)
Calcium: 8.8 mg/dL — ABNORMAL LOW (ref 8.9–10.3)
Chloride: 103 mmol/L (ref 98–111)
Creatinine, Ser: 0.94 mg/dL (ref 0.61–1.24)
GFR, Estimated: >60 mL/min (ref >60)
Glucose, Bld: 206 mg/dL — ABNORMAL HIGH (ref 70–99)
Potassium: 2.7 mmol/L — CL (ref 3.5–5.1)
Sodium: 134 mmol/L — ABNORMAL LOW (ref 135–145)
Total Bilirubin: 0.8 mg/dL (ref 0.3–1.2)
Total Protein: 7.2 g/dL (ref 6.5–8.1)

## 2021-02-16 LAB — CBC
HCT: 40 % (ref 39.0–52.0)
Hemoglobin: 13.9 g/dL (ref 13.0–17.0)
MCH: 33.2 pg (ref 26.0–34.0)
MCHC: 34.8 g/dL (ref 30.0–36.0)
MCV: 95.5 fL (ref 80.0–100.0)
Platelets: 236 10*3/uL (ref 150–400)
RBC: 4.19 MIL/uL — ABNORMAL LOW (ref 4.22–5.81)
RDW: 13.6 % (ref 11.5–15.5)
WBC: 11.9 10*3/uL — ABNORMAL HIGH (ref 4.0–10.5)
nRBC: 0 % (ref 0.0–0.2)

## 2021-02-16 LAB — TROPONIN I (HIGH SENSITIVITY)
Troponin I (High Sensitivity): 11 ng/L (ref <18)
Troponin I (High Sensitivity): 14 ng/L (ref <18)

## 2021-02-16 MED ORDER — POTASSIUM CHLORIDE 10 MEQ/100ML IV SOLN
10.0000 meq | Freq: Once | INTRAVENOUS | Status: AC
  Administered 2021-02-16: 08:00:00 10 meq via INTRAVENOUS
  Filled 2021-02-16: qty 100

## 2021-02-16 MED ORDER — POTASSIUM CHLORIDE ER 10 MEQ PO TBCR: 10.0000 meq | EXTENDED_RELEASE_TABLET | Freq: Every day | ORAL | 0 refills | Status: AC

## 2021-02-16 MED ORDER — FENTANYL CITRATE (PF) 100 MCG/2ML IJ SOLN
50.0000 ug | Freq: Once | INTRAMUSCULAR | Status: AC
  Administered 2021-02-16: 07:00:00 50 ug via INTRAVENOUS
  Filled 2021-02-16: qty 2

## 2021-02-16 MED ORDER — SODIUM CHLORIDE 0.9 % IV BOLUS
1000.0000 mL | Freq: Once | INTRAVENOUS | Status: AC
  Administered 2021-02-16: 07:00:00 1000 mL via INTRAVENOUS

## 2021-02-16 MED ORDER — MAGNESIUM SULFATE 2 GM/50ML IV SOLN
2.0000 g | Freq: Once | INTRAVENOUS | Status: AC
  Administered 2021-02-16: 08:00:00 2 g via INTRAVENOUS
  Filled 2021-02-16: qty 50

## 2021-02-16 MED ORDER — ASPIRIN 325 MG PO TABS
325.0000 mg | ORAL_TABLET | Freq: Once | ORAL | Status: AC
  Administered 2021-02-16: 07:00:00 325 mg via ORAL
  Filled 2021-02-16: qty 1

## 2021-02-16 NOTE — ED Provider Notes (Signed)
Two top neg K given Stable for d/c   Eber Hong, MD 02/16/21 1126

## 2021-02-16 NOTE — Discharge Instructions (Signed)
Your heart tests were normal Your potassium test were low that is why we have given you potassium in the IV Take potassium once a day for 7 days Have your family doctor recheck your potassium level at the end of the week Talk to your doctor about cardiology referral if you continue to have chest pain  emergency department for severe or worsening symptoms
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
## 2021-02-16 NOTE — ED Triage Notes (Signed)
Pt c/o chest pain that started about midnight. States pain is on the left side and wraps around to his side. Denies hx of same.

## 2022-04-13 IMAGING — DX DG LUMBAR SPINE COMPLETE 4+V
5 series · 5 of 5 positions shown · non-contrast
Comparison: None.

CLINICAL DATA: Bilateral lower back pain for 4 days

EXAM:
LUMBAR SPINE - COMPLETE 4+ VIEW

[l-spine ap]
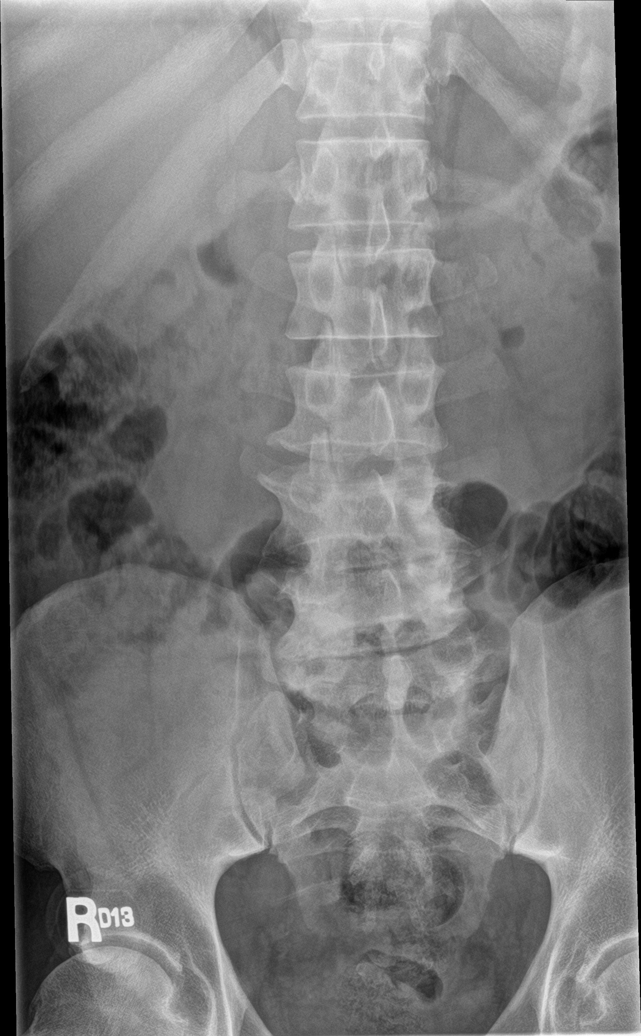

[l-spine obl (1 of 2)]
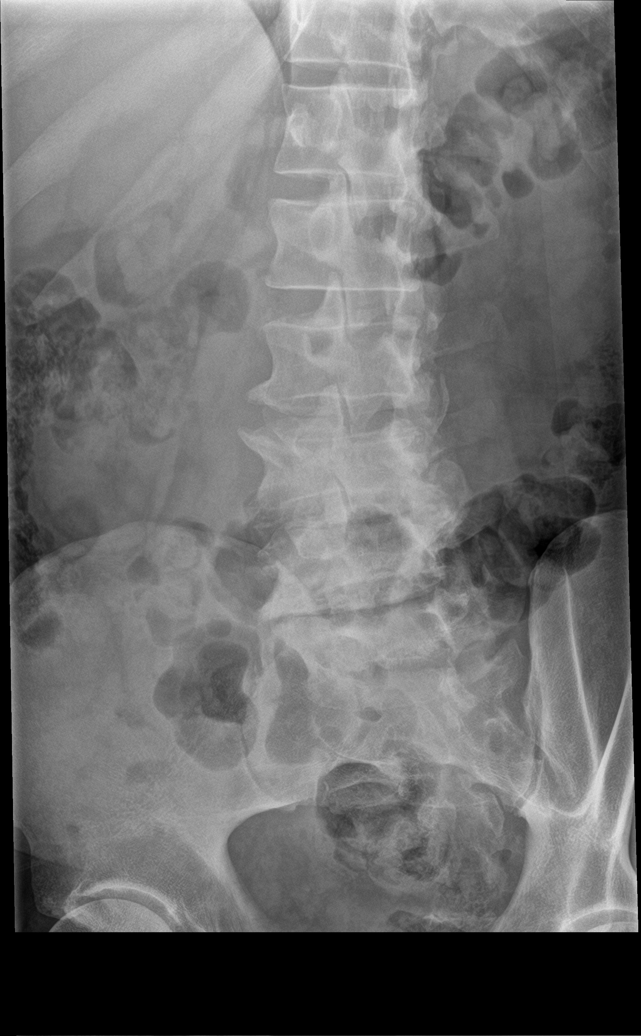

[l-spine obl (2 of 2)]
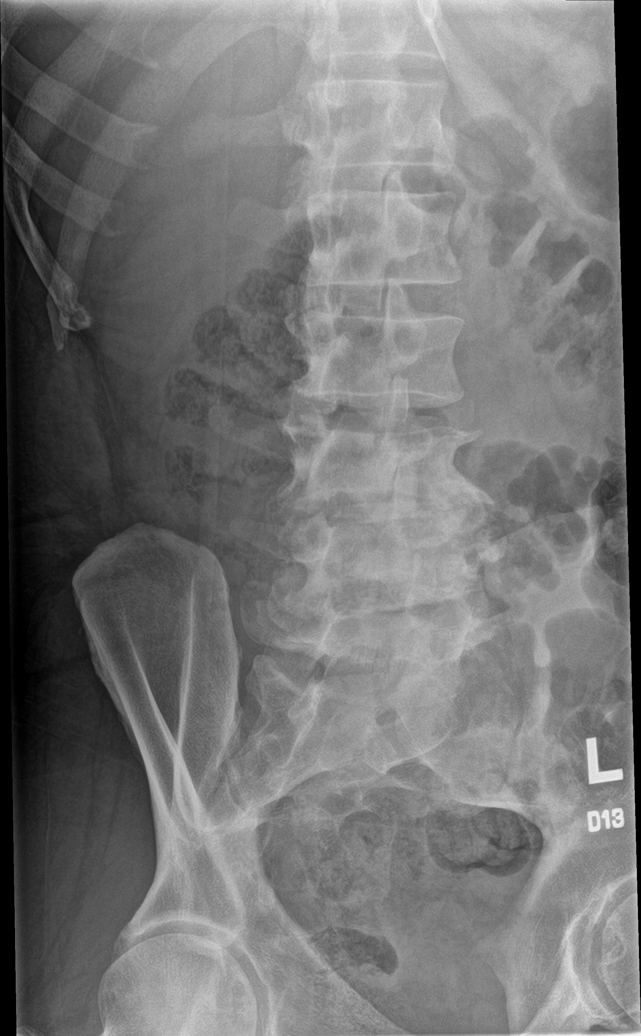

[l-spine lat]
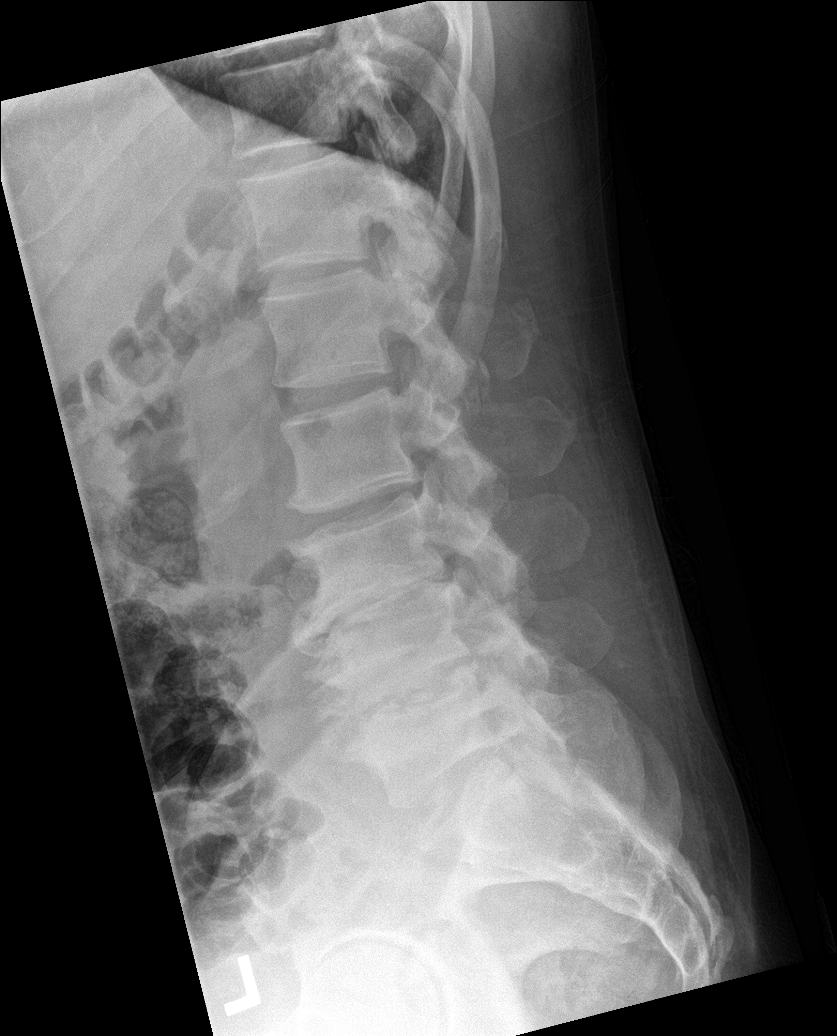

[l-spine spot]
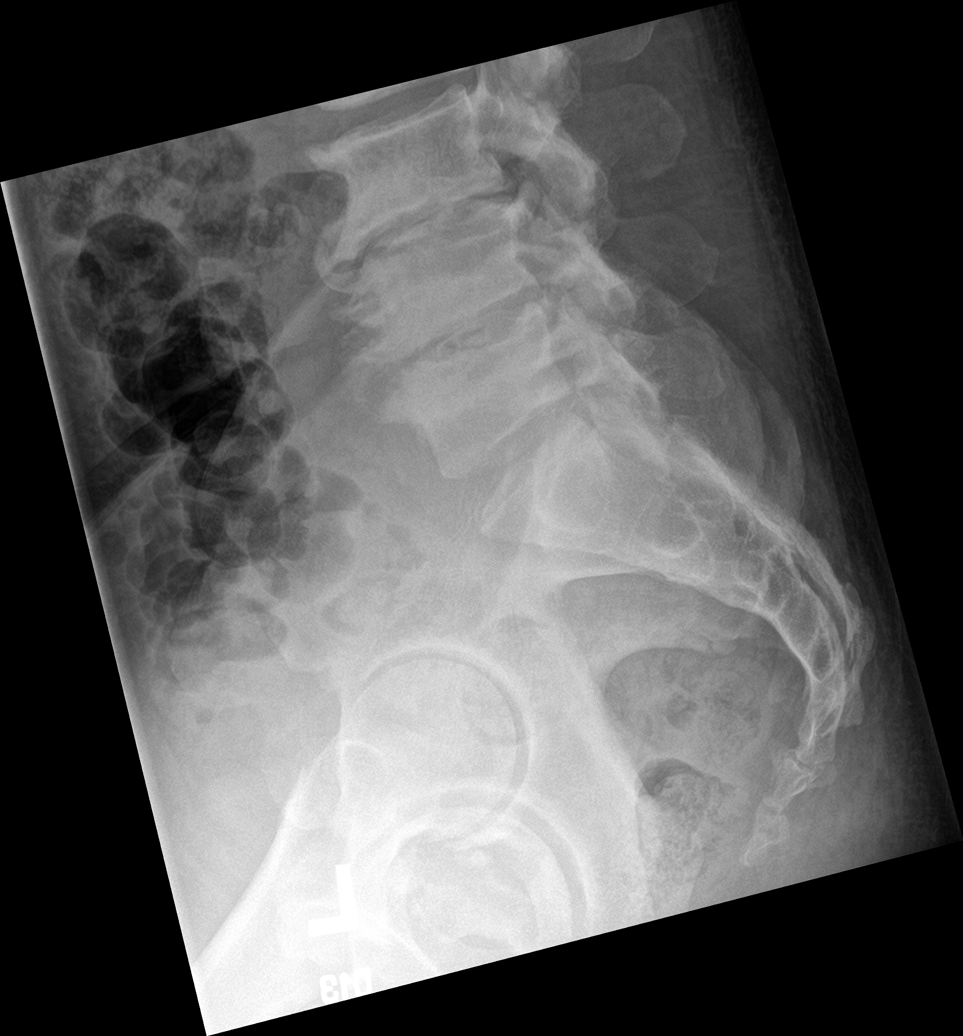

[5 of 5 positions shown; findings below may reference images not displayed]

FINDINGS: Frontal, bilateral oblique, lateral views of the lumbar spine are
obtained. There are 5 non-rib-bearing lumbar type vertebral bodies
in grossly normal alignment. There is severe spondylosis at L3-4 and
L4-5, with associated facet hypertrophic changes. There are no acute
displaced fractures. Sacroiliac joints are unremarkable.
IMPRESSION: 1. Prominent lower lumbar spondylosis and facet hypertrophy. No
acute fracture.
